# Patient Record
Sex: Female | Born: 1985 | Hispanic: No | Marital: Married | Smoking: Never smoker
Health system: Southern US, Community
[De-identification: ages and names within clinical notes are randomized; demographics above are authoritative.]

## PROBLEM LIST (undated history)

## (undated) ENCOUNTER — Inpatient Hospital Stay (HOSPITAL_COMMUNITY): Payer: Self-pay

## (undated) DIAGNOSIS — Z789 Other specified health status: Secondary | ICD-10-CM

## (undated) DIAGNOSIS — O99119 Other diseases of the blood and blood-forming organs and certain disorders involving the immune mechanism complicating pregnancy, unspecified trimester: Principal | ICD-10-CM

## (undated) DIAGNOSIS — O0990 Supervision of high risk pregnancy, unspecified, unspecified trimester: Secondary | ICD-10-CM

## (undated) DIAGNOSIS — D696 Thrombocytopenia, unspecified: Principal | ICD-10-CM

## (undated) HISTORY — DX: Other diseases of the blood and blood-forming organs and certain disorders involving the immune mechanism complicating pregnancy, unspecified trimester: O99.119

## (undated) HISTORY — DX: Supervision of high risk pregnancy, unspecified, unspecified trimester: O09.90

## (undated) HISTORY — PX: NO PAST SURGERIES: SHX2092

## (undated) HISTORY — DX: Thrombocytopenia, unspecified: D69.6

---

## 2014-04-14 ENCOUNTER — Telehealth: Payer: Self-pay | Admitting: Hematology & Oncology

## 2014-04-14 NOTE — Telephone Encounter (Signed)
Left vm w NEW PATIENT today to remind them of their appointment with Dr. Ennever. Also, advised them to bring all medication bottles and insurance card information. ° °

## 2014-04-15 ENCOUNTER — Ambulatory Visit: Payer: Self-pay

## 2014-04-15 ENCOUNTER — Ambulatory Visit: Payer: Self-pay | Admitting: Family

## 2014-04-15 ENCOUNTER — Other Ambulatory Visit: Payer: Self-pay | Admitting: Lab

## 2017-04-19 LAB — OB RESULTS CONSOLE HEPATITIS B SURFACE ANTIGEN: Hepatitis B Surface Ag: NEGATIVE

## 2017-04-19 LAB — OB RESULTS CONSOLE RPR: RPR: NONREACTIVE

## 2017-04-19 LAB — OB RESULTS CONSOLE RUBELLA ANTIBODY, IGM: Rubella: IMMUNE

## 2017-04-19 LAB — OB RESULTS CONSOLE HIV ANTIBODY (ROUTINE TESTING): HIV: NONREACTIVE

## 2017-05-03 LAB — OB RESULTS CONSOLE GC/CHLAMYDIA
Chlamydia: NEGATIVE
Gonorrhea: NEGATIVE

## 2017-07-25 NOTE — L&D Delivery Note (Signed)
Delivery Note At 2:57 PM a viable and healthy female was delivered via  (Presentation: LOA  ).  APGAR: 8, 9; weight pending.   Placenta status: spontaneous, intact.  Cord:  with the following complications: none.  Cord pH: na  Anesthesia:  epidural Episiotomy:  na Lacerations:  second Suture Repair: 2.0 vicryl rapide Est. Blood Loss (mL):  300  Mom to postpartum.  Baby to Couplet care / Skin to Skin.  Petrina Melby J 11/17/2017, 3:21 PM

## 2017-08-27 ENCOUNTER — Inpatient Hospital Stay (HOSPITAL_COMMUNITY)
Admission: AD | Admit: 2017-08-27 | Discharge: 2017-08-28 | Disposition: A | Discharge status: Home / Self Care | Payer: Managed Care, Other (non HMO) | Source: Ambulatory Visit | Attending: Obstetrics and Gynecology | Admitting: Obstetrics and Gynecology

## 2017-08-27 ENCOUNTER — Inpatient Hospital Stay (HOSPITAL_COMMUNITY): Payer: Managed Care, Other (non HMO)

## 2017-08-27 ENCOUNTER — Encounter (HOSPITAL_COMMUNITY): Payer: Self-pay | Admitting: *Deleted

## 2017-08-27 DIAGNOSIS — O288 Other abnormal findings on antenatal screening of mother: Secondary | ICD-10-CM

## 2017-08-27 DIAGNOSIS — Z3A27 27 weeks gestation of pregnancy: Secondary | ICD-10-CM | POA: Diagnosis not present

## 2017-08-27 DIAGNOSIS — O99512 Diseases of the respiratory system complicating pregnancy, second trimester: Secondary | ICD-10-CM | POA: Diagnosis not present

## 2017-08-27 DIAGNOSIS — J101 Influenza due to other identified influenza virus with other respiratory manifestations: Secondary | ICD-10-CM | POA: Insufficient documentation

## 2017-08-27 DIAGNOSIS — J069 Acute upper respiratory infection, unspecified: Secondary | ICD-10-CM

## 2017-08-27 DIAGNOSIS — R509 Fever, unspecified: Secondary | ICD-10-CM | POA: Diagnosis present

## 2017-08-27 HISTORY — DX: Other specified health status: Z78.9

## 2017-08-27 LAB — URINALYSIS, ROUTINE W REFLEX MICROSCOPIC
Bilirubin Urine: NEGATIVE
Glucose, UA: NEGATIVE mg/dL
Hgb urine dipstick: NEGATIVE
Ketones, ur: NEGATIVE mg/dL
Leukocytes, UA: NEGATIVE
Nitrite: NEGATIVE
Protein, ur: NEGATIVE mg/dL
Specific Gravity, Urine: 1.005 (ref 1.005–1.030)
pH: 7 (ref 5.0–8.0)

## 2017-08-27 LAB — INFLUENZA PANEL BY PCR (TYPE A & B)
Influenza A By PCR: POSITIVE — AB
Influenza B By PCR: NEGATIVE

## 2017-08-27 MED ORDER — ACETAMINOPHEN 325 MG PO TABS
650.0000 mg | ORAL_TABLET | Freq: Once | ORAL | Status: AC
  Administered 2017-08-27: 650 mg via ORAL
  Filled 2017-08-27: qty 2

## 2017-08-27 MED ORDER — OSELTAMIVIR PHOSPHATE 75 MG PO CAPS: 75.0000 mg | ORAL_CAPSULE | Freq: Two times a day (BID) | ORAL | 0 refills | Status: DC

## 2017-08-27 NOTE — MAU Note (Signed)
Pt presents to MAU c/o dry cough and a fever of 100-101.6.  Pt states she feels very tired. No vaginal bleeding or LOF. Pt reports normal vaginal discharge. Pt reports fetal movement.

## 2017-08-27 NOTE — MAU Provider Note (Signed)
History     CSN: [id number]  Arrival date and time: 08/27/17 [id number]   First Provider Initiated Contact with Patient 08/27/17 2014      Chief Complaint  Patient presents with  . Fever   HPI Pamela Stephens 32 y.o. [id number]  Comes to MAU tonight with fever and dry cough that occurs primarily when she is sitting up in bed.  Has had nasal congestion in the pregnancy and was advised to use a humidifier and saline nasal drops.  She had used the humidifier until she moved a few days ago.  Today she has felt bad and has been in bed sleeping most of the day.  She has not taken any tylenol today.  Has been using hot water with ginger and honey but is continuing to cough.  Client has had a flu shot this season.  Next appointment in the office is Friday for glucola testing.  OB History    Gravida Para Term Preterm AB Living   2             SAB TAB Ectopic Multiple Live Births                  Past Medical History:  Diagnosis Date  . Medical history non-contributory     Past Surgical History:  Procedure Laterality Date  . NO PAST SURGERIES      Family History  Problem Relation Age of Onset  . Diabetes Mother   . Cancer Maternal Grandmother   . Diabetes Maternal Grandmother     Social History   Tobacco Use  . Smoking status: Never Smoker  . Smokeless tobacco: Never Used  Substance Use Topics  . Alcohol use: No    Frequency: Never  . Drug use: No    Allergies: No Known Allergies  Medications Prior to Admission  Medication Sig Dispense Refill Last Dose  . Prenatal Vit-Fe Fumarate-FA (PRENATAL MULTIVITAMIN) TABS tablet Take 1 tablet by mouth daily at 12 noon.   08/26/2017 at Unknown time    Review of Systems  Constitutional: Positive for fever.  HENT: Positive for congestion. Negative for sneezing.   Respiratory: Positive for cough. Negative for shortness of breath and wheezing.   Gastrointestinal: Negative for abdominal pain, diarrhea, nausea and vomiting.  Genitourinary:  Negative for dysuria, vaginal bleeding and vaginal discharge.   Physical Exam   Blood pressure (!) 115/54, pulse (!) 129, temperature (!) 101.2 F (38.4 C), temperature source Oral, resp. rate 20, height 5\' 4"  (1.626 m), weight 138 lb 1.9 oz (62.7 kg), SpO2 100 %.  Physical Exam  Nursing note and vitals reviewed. Constitutional: She is oriented to person, place, and time. She appears well-developed and well-nourished.  HENT:  Head: Normocephalic.  Mouth/Throat: No oropharyngeal exudate.  Pharynx lightly pink, tonsils normal size  Eyes: EOM are normal. Right eye exhibits no discharge. Left eye exhibits no discharge.  Inner lids red  Neck: Neck supple.  Cardiovascular: Regular rhythm and normal heart sounds.  Mild tachycardia - 117-120  Respiratory: Effort normal and breath sounds normal. She has no wheezes.  GI: Soft. There is no tenderness.  FHT baseline 150 with moderate variability and 10x10 accels noted - reassuring strip.  No contractions felt by client or palpated.  No decelerations.  Will discontinue the monitor for now as client has URI rather than pelvic symptoms.  Musculoskeletal: Normal range of motion.  Neurological: She is alert and oriented to person, place, and time.  Skin: Skin is warm and  dry.  Psychiatric: She has a normal mood and affect.    MAU Course  Procedures  MDM Care assumed by L Leftwich, CNM at 2100  Assessment and Plan    Currie Paris 08/27/2017, 8:57 PM   Results for orders placed or performed during the hospital encounter of 08/27/17 (from the past 24 hour(s))  Urinalysis, Routine w reflex microscopic     Status: Abnormal   Collection Time: 08/27/17  7:49 PM  Result Value Ref Range   Color, Urine STRAW (A) YELLOW   APPearance CLEAR CLEAR   Specific Gravity, Urine 1.005 1.005 - 1.030   pH 7.0 5.0 - 8.0   Glucose, UA NEGATIVE NEGATIVE mg/dL   Hgb urine dipstick NEGATIVE NEGATIVE   Bilirubin Urine NEGATIVE NEGATIVE   Ketones, ur NEGATIVE  NEGATIVE mg/dL   Protein, ur NEGATIVE NEGATIVE mg/dL   Nitrite NEGATIVE NEGATIVE   Leukocytes, UA NEGATIVE NEGATIVE  Influenza panel by PCR (type A & B)     Status: Abnormal   Collection Time: 08/27/17  8:18 PM  Result Value Ref Range   Influenza A By PCR POSITIVE (A) NEGATIVE   Influenza B By PCR NEGATIVE NEGATIVE    MDM:  Pt flu positive today.  FHR tracing overall reactive (baseline 135, moderate variability, positive accels) but 2 questionable variables versus artifact with pt position.  Intermittent episodes of contractions 2-3 minutes apart, palpate mild, pt is not aware of them. Cervix 0/thick/high/posterior so no signs of labor.  Consult Dr Amado Nash with assessment and findings. BPP done and results 8/8.  D/C home with Tamiflu Rx.  List of safe OTC meds in pregnancy given.  Pt has appt in office with Taavon on Friday 2/8.  Pt may be able to keep this appt if symptoms improved but call office tomorrow.  D/C with infection precautions.  A: 1. Influenza A   2. NST (non-stress test) nonreactive   3. Acute upper respiratory infection   4. Pregnancy with 27 completed weeks gestation     P: D/C home  Sharen Counter, CNM 1:09 AM

## 2017-08-28 MED ORDER — OSELTAMIVIR PHOSPHATE 75 MG PO CAPS: 75.0000 mg | ORAL_CAPSULE | Freq: Two times a day (BID) | ORAL | 0 refills | Status: AC

## 2017-09-04 ENCOUNTER — Encounter: Payer: Self-pay | Admitting: Oncology

## 2017-09-04 ENCOUNTER — Ambulatory Visit (INDEPENDENT_AMBULATORY_CARE_PROVIDER_SITE_OTHER): Payer: Managed Care, Other (non HMO) | Admitting: Oncology

## 2017-09-04 ENCOUNTER — Other Ambulatory Visit: Payer: Self-pay | Admitting: Oncology

## 2017-09-04 ENCOUNTER — Other Ambulatory Visit: Payer: Self-pay

## 2017-09-04 VITALS — BP 123/71 | HR 86 | Temp 98.1°F | Ht 64.0 in | Wt 136.8 lb

## 2017-09-04 DIAGNOSIS — J302 Other seasonal allergic rhinitis: Secondary | ICD-10-CM

## 2017-09-04 DIAGNOSIS — J101 Influenza due to other identified influenza virus with other respiratory manifestations: Secondary | ICD-10-CM | POA: Diagnosis not present

## 2017-09-04 DIAGNOSIS — Z3A29 29 weeks gestation of pregnancy: Secondary | ICD-10-CM | POA: Diagnosis not present

## 2017-09-04 DIAGNOSIS — Z8613 Personal history of malaria: Secondary | ICD-10-CM | POA: Diagnosis not present

## 2017-09-04 DIAGNOSIS — D693 Immune thrombocytopenic purpura: Secondary | ICD-10-CM | POA: Diagnosis not present

## 2017-09-04 DIAGNOSIS — O99119 Other diseases of the blood and blood-forming organs and certain disorders involving the immune mechanism complicating pregnancy, unspecified trimester: Principal | ICD-10-CM

## 2017-09-04 DIAGNOSIS — D696 Thrombocytopenia, unspecified: Secondary | ICD-10-CM

## 2017-09-04 DIAGNOSIS — O99113 Other diseases of the blood and blood-forming organs and certain disorders involving the immune mechanism complicating pregnancy, third trimester: Secondary | ICD-10-CM | POA: Diagnosis not present

## 2017-09-04 DIAGNOSIS — O0990 Supervision of high risk pregnancy, unspecified, unspecified trimester: Secondary | ICD-10-CM

## 2017-09-04 DIAGNOSIS — Z7952 Long term (current) use of systemic steroids: Secondary | ICD-10-CM

## 2017-09-04 HISTORY — DX: Thrombocytopenia, unspecified: D69.6

## 2017-09-04 HISTORY — DX: Thrombocytopenia, unspecified: O99.119

## 2017-09-04 HISTORY — DX: Supervision of high risk pregnancy, unspecified, unspecified trimester: O09.90

## 2017-09-04 LAB — CBC WITH DIFFERENTIAL/PLATELET
Basophils Absolute: 0 10*3/uL (ref 0.0–0.1)
Basophils Relative: 0 %
Eosinophils Absolute: 0.1 10*3/uL (ref 0.0–0.7)
Eosinophils Relative: 1 %
HCT: 37.7 % (ref 36.0–46.0)
Hemoglobin: 12.3 g/dL (ref 12.0–15.0)
Lymphocytes Relative: 26 %
Lymphs Abs: 2.6 10*3/uL (ref 0.7–4.0)
MCH: 29.8 pg (ref 26.0–34.0)
MCHC: 32.6 g/dL (ref 30.0–36.0)
MCV: 91.3 fL (ref 78.0–100.0)
Monocytes Absolute: 0.6 10*3/uL (ref 0.1–1.0)
Monocytes Relative: 6 %
Neutro Abs: 6.6 10*3/uL (ref 1.7–7.7)
Neutrophils Relative %: 67 %
Platelets: 95 10*3/uL — ABNORMAL LOW (ref 150–400)
RBC: 4.13 MIL/uL (ref 3.87–5.11)
RDW: 13.5 % (ref 11.5–15.5)
WBC: 9.9 10*3/uL (ref 4.0–10.5)

## 2017-09-04 LAB — COMPREHENSIVE METABOLIC PANEL
ALT: 25 U/L (ref 14–54)
AST: 35 U/L (ref 15–41)
Albumin: 3.2 g/dL — ABNORMAL LOW (ref 3.5–5.0)
Alkaline Phosphatase: 115 U/L (ref 38–126)
Anion gap: 12 (ref 5–15)
BUN: <5 mg/dL — ABNORMAL LOW (ref 6–20)
CO2: 24 mmol/L (ref 22–32)
Calcium: 9.2 mg/dL (ref 8.9–10.3)
Chloride: 102 mmol/L (ref 101–111)
Creatinine, Ser: 0.57 mg/dL (ref 0.44–1.00)
GFR calc Af Amer: >60 mL/min (ref >60)
GFR calc non Af Amer: >60 mL/min (ref >60)
Glucose, Bld: 118 mg/dL — ABNORMAL HIGH (ref 65–99)
Potassium: 3.6 mmol/L (ref 3.5–5.1)
Sodium: 138 mmol/L (ref 135–145)
Total Bilirubin: 0.3 mg/dL (ref 0.3–1.2)
Total Protein: 6.9 g/dL (ref 6.5–8.1)

## 2017-09-04 LAB — SAVE SMEAR

## 2017-09-04 LAB — LACTATE DEHYDROGENASE: LDH: 197 U/L — ABNORMAL HIGH (ref 98–192)

## 2017-09-04 NOTE — Patient Instructions (Signed)
Stay on prednisone 40 mg daily Return every week for a platelet count: next Monday 2/18 I will likely be able to reduce the dose if counts good next week

## 2017-09-04 NOTE — Progress Notes (Signed)
New Patient Hematology   Pamela Stephens [id number] 1985/11/26 31 y.o. 09/04/2017  CC: Dr. Flavia Shipperichard tailbone  Reason for referral: Severe thrombocytopenia week 29 first pregnancy   HPI:  Pleasant 32 year old woman who emigrated to this country from [place] about 5 years ago.  She has been in overall excellent health without any major medical or surgical illness.  She did have malaria as a child age 89. In September 2015 she went to an urgent care center for evaluation of upper respiratory symptoms associated with seasonal allergies.  Blood counts were done and she was told her platelet count was 55,000.  A hematology referral was made but when platelet count was checked 4 days later it was 225,000 and a subsequent follow-up 1 month later remained normal. On Saturday, February 2 she developed a dry cough, fatigue, but no fever or GI complaints.  She saw her obstetrician.  Baby was doing fine.  Influenza panel was checked and she was positive for influenza A.  She was started on Tamiflu.  She took the medication for 2 days but then stopped because she developed diarrhea and by that time her cough had resolved.  Unexpectedly, blood counts drawn on day of visit February 8 showed a platelet count of 15,000 reproducible x2.  Hemoglobin 12.  White count 5000.  62% neutrophils.  31 lymphocytes. She was on no other medication except for prenatal vitamins.  No known medication allergies.  No alcoholic beverages since becoming pregnant.  Only rare glass of wine or beer prior to that.  No quinine-containing beverages or quinine supplements.  She took a muscle relaxant for about 2 days 2 months ago.  No signs or symptoms of a collagen vascular disease and specifically no polymyalgia, polyarthralgia, unusual skin rashes, or hair loss. There is no family history of any blood disorder.  She is an only child.  When I received the call on February 8 from her OB/GYN I advised initiation of steroids, prednisone 40 mg  daily.  I called the patient to make sure she had no bleeding and gave her my cell phone to call me over the weekend if she developed any problems.  Fortunately she did well and had no issues over the weekend.  PMH: Past Medical History:  Diagnosis Date  . Medical history non-contributory   . Pregnancy, high-risk 09/04/2017  . Thrombocytopenia affecting pregnancy, antepartum (HCC) 09/04/2017   Platelet count 15,000 07/31/2017 2nd trimester 28 weeks 1st pregnancy  Migraine headache.  Malaria at age 89.  Jaundice at that time.  Denies mononucleosis, hepatitis, tuberculosis, pneumonia.  Past Surgical History:  Procedure Laterality Date  . NO PAST SURGERIES      Allergies: No Known Allergies  Medications:.  I.  She took 20 mg on September 01 1038 mg on February 9 and 10.  Current Outpatient Medications:  .  Prenatal Vit-Fe Fumarate-FA (PRENATAL MULTIVITAMIN) TABS tablet, Take 1 tablet by mouth daily at 12 noon., Disp: , Rfl:   Social History: She has a degree in pharmacology in [place].  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 She  has never smoked.  She drinks an occasional glass of beer or wine not more than once per week and stopped all alcohol consumption at time of pregnancy.  She does not use drugs.  Family History: Family History  Problem Relation Age of Onset  . Diabetes Mother   . Cancer Maternal Grandmother   . Diabetes Maternal Grandmother   Father died  at age 22 of sudden cardiac death.  He was an alcohol abuser.  Mother alive and well at age 96 with recently diagnosed diabetes.  Review of Systems: No easy bruisability.  She has not noted any petechial rash. She denied any headache or blurred vision. No epistaxis, gum bleeding, hematuria, melena, or hematochezia. She is tolerating the prednisone well.  No GI upset.  No insomnia.  Mild elevation of blood glucose today at 118 and expected increase in her white blood count at  9900. Remaining ROS negative.  Physical Exam: Blood pressure 123/71, pulse 86, temperature 98.1 F (36.7 C), temperature source Oral, height 5\' 4"  (1.626 m), weight 136 lb 12.8 oz (62.1 kg), SpO2 100 %. Wt Readings from Last 3 Encounters:  09/04/17 136 lb 12.8 oz (62.1 kg)  08/27/17 138 lb 1.9 oz (62.7 kg)     General appearance: Well-nourished Asian woman HENNT: Pharynx no erythema, exudate, mass, or ulcer. No thyromegaly or thyroid nodules Lymph nodes: No cervical, supraclavicular, or axillary lymphadenopathy Breasts:  Lungs: Clear to auscultation, resonant to percussion throughout Heart: Regular rhythm, no murmur, no gallop, no rub, no click, no edema Abdomen: Soft, nontender, normal bowel sounds, no mass, second trimester pregnancy Extremities: No edema, no calf tenderness Musculoskeletal: no joint deformities GU: Vascular:  Neurologic: Alert, oriented, PERRLA, optic discs sharp and vessels normal, no hemorrhage or exudate, cranial nerves grossly normal, motor strength 5 over 5, reflexes 2+ symmetric, upper body coordination normal, gait normal, Skin: No rash, petechiae, or ecchymosis    Lab Results: Lab Results  Component Value Date   WBC 9.9 09/04/2017   HGB 12.3 09/04/2017   HCT 37.7 09/04/2017   MCV 91.3 09/04/2017   PLT 95 (L) 09/04/2017     Chemistry      Component Value Date/Time   NA 138 09/04/2017 1353   K 3.6 09/04/2017 1353   CL 102 09/04/2017 1353   CO2 24 09/04/2017 1353   BUN <5 (L) 09/04/2017 1353   CREATININE 0.57 09/04/2017 1353      Component Value Date/Time   CALCIUM 9.2 09/04/2017 1353   ALKPHOS 115 09/04/2017 1353   AST 35 09/04/2017 1353   ALT 25 09/04/2017 1353   BILITOT 0.3 09/04/2017 1353       Review of peripheral blood film: Normochromic normocytic red cells.  No schistocytes.  Mature neutrophils and lymphocytes.  Platelets appear normal in number at least 10 per high-power field with a subpopulation of large platelets.  Estimate  from smear higher than machine estimate: Approximately 150,000.   Radiological Studies: No results found.    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 Since we do not have any blood counts from earlier in the pregnancy it is difficult to assess the acuity of the fall in her platelets.  Given the extremely low count and giving her the benefit of the doubt, despite the fact that her platelet count today is near normal, I would like to continue the steroids.  Recommendation: Continue prednisone 40 mg daily. Weekly blood counts for the duration of the pregnancy. If platelet count is normal next week, I would likely decrease the prednisone to 20 mg daily.    Cephas Darby, MD, FACP  Hematology-Oncology/Internal Medicine  09/04/2017, 5:09 PM

## 2017-09-04 NOTE — Addendum Note (Signed)
Addended by: Remus BlakeBARROW, Nigel Wessman K on: 09/04/2017 01:51 PM   Modules accepted: Orders

## 2017-09-06 LAB — ANTINUCLEAR ANTIBODIES, IFA: ANA Ab, IFA: NEGATIVE

## 2017-09-11 ENCOUNTER — Telehealth: Payer: Self-pay | Admitting: *Deleted

## 2017-09-11 ENCOUNTER — Other Ambulatory Visit (INDEPENDENT_AMBULATORY_CARE_PROVIDER_SITE_OTHER): Payer: Managed Care, Other (non HMO)

## 2017-09-11 DIAGNOSIS — D696 Thrombocytopenia, unspecified: Secondary | ICD-10-CM

## 2017-09-11 DIAGNOSIS — O0990 Supervision of high risk pregnancy, unspecified, unspecified trimester: Secondary | ICD-10-CM

## 2017-09-11 DIAGNOSIS — O99119 Other diseases of the blood and blood-forming organs and certain disorders involving the immune mechanism complicating pregnancy, unspecified trimester: Secondary | ICD-10-CM | POA: Diagnosis not present

## 2017-09-11 LAB — CBC WITH DIFFERENTIAL/PLATELET
Basophils Absolute: 0 10*3/uL (ref 0.0–0.1)
Basophils Relative: 0 %
Eosinophils Absolute: 0 10*3/uL (ref 0.0–0.7)
Eosinophils Relative: 0 %
HCT: 37.3 % (ref 36.0–46.0)
Hemoglobin: 12 g/dL (ref 12.0–15.0)
Lymphocytes Relative: 22 %
Lymphs Abs: 2.3 10*3/uL (ref 0.7–4.0)
MCH: 29.5 pg (ref 26.0–34.0)
MCHC: 32.2 g/dL (ref 30.0–36.0)
MCV: 91.6 fL (ref 78.0–100.0)
Monocytes Absolute: 0.6 10*3/uL (ref 0.1–1.0)
Monocytes Relative: 6 %
Neutro Abs: 7.3 10*3/uL (ref 1.7–7.7)
Neutrophils Relative %: 72 %
Platelets: 288 10*3/uL (ref 150–400)
RBC: 4.07 MIL/uL (ref 3.87–5.11)
RDW: 13.6 % (ref 11.5–15.5)
WBC: 10.2 10*3/uL (ref 4.0–10.5)

## 2017-09-11 NOTE — Telephone Encounter (Signed)
Pt called / informed "platelet count normal at 288,000. Decrease prednisone to 1 pill = 20 mg daily. Repeat count in 1 week, if still good, will decrease to 10 mg then probably stop 1 week after that " per Dr Cyndie ChimeGranfortuna.  Lab appt scheduled next Monday @ 1130 AM.

## 2017-09-11 NOTE — Telephone Encounter (Signed)
-----   Message from Levert FeinsteinJames M Granfortuna, MD sent at 09/11/2017  1:10 PM EST ----- Call pt: platelet count normal at 288,000. Decrease prednisone to 1 pill = 20 mg daily. Repeat count in 1 week, if still good, will decrease to 10 mg then probably stop 1 week after that

## 2017-09-11 NOTE — Addendum Note (Signed)
Addended by: Remus BlakeBARROW, Maurice Fotheringham K on: 09/11/2017 12:02 PM   Modules accepted: Orders

## 2017-09-18 ENCOUNTER — Other Ambulatory Visit: Payer: Managed Care, Other (non HMO)

## 2017-09-18 ENCOUNTER — Telehealth: Payer: Self-pay | Admitting: *Deleted

## 2017-09-18 ENCOUNTER — Other Ambulatory Visit (INDEPENDENT_AMBULATORY_CARE_PROVIDER_SITE_OTHER): Payer: Managed Care, Other (non HMO)

## 2017-09-18 DIAGNOSIS — O99119 Other diseases of the blood and blood-forming organs and certain disorders involving the immune mechanism complicating pregnancy, unspecified trimester: Secondary | ICD-10-CM | POA: Diagnosis not present

## 2017-09-18 DIAGNOSIS — D696 Thrombocytopenia, unspecified: Secondary | ICD-10-CM | POA: Diagnosis not present

## 2017-09-18 DIAGNOSIS — O0990 Supervision of high risk pregnancy, unspecified, unspecified trimester: Secondary | ICD-10-CM

## 2017-09-18 LAB — CBC WITH DIFFERENTIAL/PLATELET
Basophils Absolute: 0 10*3/uL (ref 0.0–0.1)
Basophils Relative: 0 %
Eosinophils Absolute: 0.1 10*3/uL (ref 0.0–0.7)
Eosinophils Relative: 0 %
HCT: 35.4 % — ABNORMAL LOW (ref 36.0–46.0)
Hemoglobin: 11.6 g/dL — ABNORMAL LOW (ref 12.0–15.0)
Lymphocytes Relative: 23 %
Lymphs Abs: 2.7 10*3/uL (ref 0.7–4.0)
MCH: 29.4 pg (ref 26.0–34.0)
MCHC: 32.8 g/dL (ref 30.0–36.0)
MCV: 89.8 fL (ref 78.0–100.0)
Monocytes Absolute: 0.9 10*3/uL (ref 0.1–1.0)
Monocytes Relative: 7 %
Neutro Abs: 8 10*3/uL — ABNORMAL HIGH (ref 1.7–7.7)
Neutrophils Relative %: 70 %
Platelets: 175 10*3/uL (ref 150–400)
RBC: 3.94 MIL/uL (ref 3.87–5.11)
RDW: 13.2 % (ref 11.5–15.5)
WBC: 11.6 10*3/uL — ABNORMAL HIGH (ref 4.0–10.5)

## 2017-09-18 NOTE — Telephone Encounter (Signed)
-----   Message from Levert FeinsteinJames M Granfortuna, MD sent at 09/18/2017  3:51 PM EST ----- Call pt: platelets god at 175,000. She can decrease prednisone to 1/2 tablet = 10 mg daily. Repeat CBC again in one week.

## 2017-09-18 NOTE — Telephone Encounter (Signed)
Pt called / informed "platelets god at 175,000. She can decrease prednisone to 1/2 tablet = 10 mg daily. Repeat CBC again in one week." per DR Granfortuna. Voiced understanding. Appt scheduled next Monday 3/4 @ 1330 PM.

## 2017-09-25 ENCOUNTER — Other Ambulatory Visit (INDEPENDENT_AMBULATORY_CARE_PROVIDER_SITE_OTHER): Payer: Managed Care, Other (non HMO)

## 2017-09-25 ENCOUNTER — Other Ambulatory Visit: Payer: Managed Care, Other (non HMO)

## 2017-09-25 ENCOUNTER — Other Ambulatory Visit: Payer: Self-pay | Admitting: Oncology

## 2017-09-25 DIAGNOSIS — O99119 Other diseases of the blood and blood-forming organs and certain disorders involving the immune mechanism complicating pregnancy, unspecified trimester: Secondary | ICD-10-CM

## 2017-09-25 DIAGNOSIS — O0990 Supervision of high risk pregnancy, unspecified, unspecified trimester: Secondary | ICD-10-CM

## 2017-09-25 DIAGNOSIS — D696 Thrombocytopenia, unspecified: Secondary | ICD-10-CM | POA: Diagnosis not present

## 2017-09-25 LAB — CBC WITH DIFFERENTIAL/PLATELET
Basophils Absolute: 0 10*3/uL (ref 0.0–0.1)
Basophils Relative: 0 %
Eosinophils Absolute: 0.1 10*3/uL (ref 0.0–0.7)
Eosinophils Relative: 1 %
HCT: 37.4 % (ref 36.0–46.0)
Hemoglobin: 12.1 g/dL (ref 12.0–15.0)
Lymphocytes Relative: 22 %
Lymphs Abs: 2.5 10*3/uL (ref 0.7–4.0)
MCH: 29.5 pg (ref 26.0–34.0)
MCHC: 32.4 g/dL (ref 30.0–36.0)
MCV: 91.2 fL (ref 78.0–100.0)
Monocytes Absolute: 0.8 10*3/uL (ref 0.1–1.0)
Monocytes Relative: 7 %
Neutro Abs: 8 10*3/uL — ABNORMAL HIGH (ref 1.7–7.7)
Neutrophils Relative %: 70 %
Platelets: 115 10*3/uL — ABNORMAL LOW (ref 150–400)
RBC: 4.1 MIL/uL (ref 3.87–5.11)
RDW: 14 % (ref 11.5–15.5)
WBC: 11.4 10*3/uL — ABNORMAL HIGH (ref 4.0–10.5)

## 2017-09-25 MED ORDER — PREDNISONE 5 MG PO TABS: 10.0000 mg | ORAL_TABLET | Freq: Every day | ORAL | 3 refills | Status: DC

## 2017-09-28 ENCOUNTER — Telehealth: Payer: Self-pay | Admitting: *Deleted

## 2017-09-28 NOTE — Telephone Encounter (Signed)
Returned pt's call - stated needs refill om Prednisone. Informed rx was sent to [facility] on [address]- telephone # given, stated she will call them and call me back if they did not receive the refill.

## 2017-10-02 ENCOUNTER — Other Ambulatory Visit (INDEPENDENT_AMBULATORY_CARE_PROVIDER_SITE_OTHER): Payer: Managed Care, Other (non HMO)

## 2017-10-02 DIAGNOSIS — O99119 Other diseases of the blood and blood-forming organs and certain disorders involving the immune mechanism complicating pregnancy, unspecified trimester: Secondary | ICD-10-CM

## 2017-10-02 DIAGNOSIS — D696 Thrombocytopenia, unspecified: Secondary | ICD-10-CM | POA: Diagnosis not present

## 2017-10-02 DIAGNOSIS — O0990 Supervision of high risk pregnancy, unspecified, unspecified trimester: Secondary | ICD-10-CM

## 2017-10-02 LAB — CBC WITH DIFFERENTIAL/PLATELET
Basophils Absolute: 0 10*3/uL (ref 0.0–0.1)
Basophils Relative: 0 %
Eosinophils Absolute: 0.1 10*3/uL (ref 0.0–0.7)
Eosinophils Relative: 1 %
HCT: 37.1 % (ref 36.0–46.0)
Hemoglobin: 11.9 g/dL — ABNORMAL LOW (ref 12.0–15.0)
Lymphocytes Relative: 23 %
Lymphs Abs: 2.2 10*3/uL (ref 0.7–4.0)
MCH: 29.2 pg (ref 26.0–34.0)
MCHC: 32.1 g/dL (ref 30.0–36.0)
MCV: 90.9 fL (ref 78.0–100.0)
Monocytes Absolute: 0.8 10*3/uL (ref 0.1–1.0)
Monocytes Relative: 8 %
Neutro Abs: 6.4 10*3/uL (ref 1.7–7.7)
Neutrophils Relative %: 68 %
Platelets: 127 10*3/uL — ABNORMAL LOW (ref 150–400)
RBC: 4.08 MIL/uL (ref 3.87–5.11)
RDW: 13.9 % (ref 11.5–15.5)
WBC: 9.5 10*3/uL (ref 4.0–10.5)

## 2017-10-03 ENCOUNTER — Telehealth: Payer: Self-pay | Admitting: General Practice

## 2017-10-03 ENCOUNTER — Telehealth: Payer: Self-pay | Admitting: *Deleted

## 2017-10-03 NOTE — Telephone Encounter (Signed)
Patient calling about labs results from [facility] or Dr Cyndie Chime

## 2017-10-03 NOTE — Telephone Encounter (Signed)
-----   Message from Levert FeinsteinJames M Granfortuna, MD sent at 10/02/2017  4:33 PM EDT ----- Please call pt: machine count on platelets today 127,000. Stay on 10 mg prednisone. Repeat CBC in 2 weeks please.

## 2017-10-03 NOTE — Telephone Encounter (Signed)
Pt called / informed "machine count on platelets  127,000. Stay on 10 mg prednisone. Repeat CBC in 2 weeks" per Dr Cyndie ChimeGranfortuna. Voiced understanding. Lab appt scheduled Mon 3/25 @ 1130 AM.

## 2017-10-16 ENCOUNTER — Other Ambulatory Visit (INDEPENDENT_AMBULATORY_CARE_PROVIDER_SITE_OTHER): Payer: Managed Care, Other (non HMO)

## 2017-10-16 DIAGNOSIS — D696 Thrombocytopenia, unspecified: Secondary | ICD-10-CM | POA: Diagnosis not present

## 2017-10-16 DIAGNOSIS — O99119 Other diseases of the blood and blood-forming organs and certain disorders involving the immune mechanism complicating pregnancy, unspecified trimester: Secondary | ICD-10-CM

## 2017-10-16 DIAGNOSIS — O0990 Supervision of high risk pregnancy, unspecified, unspecified trimester: Secondary | ICD-10-CM

## 2017-10-16 LAB — CBC WITH DIFFERENTIAL/PLATELET
Basophils Absolute: 0 10*3/uL (ref 0.0–0.1)
Basophils Relative: 0 %
Eosinophils Absolute: 0 10*3/uL (ref 0.0–0.7)
Eosinophils Relative: 0 %
HCT: 37.7 % (ref 36.0–46.0)
Hemoglobin: 12.2 g/dL (ref 12.0–15.0)
Lymphocytes Relative: 13 %
Lymphs Abs: 1.5 10*3/uL (ref 0.7–4.0)
MCH: 28.2 pg (ref 26.0–34.0)
MCHC: 32.4 g/dL (ref 30.0–36.0)
MCV: 87.3 fL (ref 78.0–100.0)
Monocytes Absolute: 0.5 10*3/uL (ref 0.1–1.0)
Monocytes Relative: 4 %
Neutro Abs: 9.8 10*3/uL — ABNORMAL HIGH (ref 1.7–7.7)
Neutrophils Relative %: 83 %
Platelets: 139 10*3/uL — ABNORMAL LOW (ref 150–400)
RBC: 4.32 MIL/uL (ref 3.87–5.11)
RDW: 13.5 % (ref 11.5–15.5)
WBC: 11.8 10*3/uL — ABNORMAL HIGH (ref 4.0–10.5)

## 2017-10-17 ENCOUNTER — Telehealth: Payer: Self-pay | Admitting: *Deleted

## 2017-10-17 ENCOUNTER — Telehealth: Payer: Self-pay | Admitting: General Practice

## 2017-10-17 NOTE — Telephone Encounter (Signed)
Pt called / informed "platelets good at 139,000; stay on 10 mg prednisone; check CBC in 2 weeks " per Dr Cyndie ChimeGranfortuna. Lab appt scheduled Apr 8 @ 1130 AM.

## 2017-10-17 NOTE — Telephone Encounter (Signed)
Patient is calling about labs results

## 2017-10-17 NOTE — Telephone Encounter (Signed)
Glenda called her

## 2017-10-17 NOTE — Telephone Encounter (Signed)
-----   Message from Levert FeinsteinJames M Granfortuna, MD sent at 10/16/2017  4:25 PM EDT ----- Call pt: platelets good at 139,000; stay on 10 mg prednisone; check CBC in 2 weeks

## 2017-10-24 LAB — OB RESULTS CONSOLE GBS: GBS: NEGATIVE

## 2017-10-27 ENCOUNTER — Other Ambulatory Visit: Payer: Self-pay

## 2017-10-27 MED ORDER — PREDNISONE 5 MG PO TABS: 10.0000 mg | ORAL_TABLET | Freq: Every day | ORAL | 3 refills | Status: DC

## 2017-10-27 NOTE — Telephone Encounter (Signed)
predniSONE (DELTASONE) 5 MG tablet, Refill request @ [facility] [address].

## 2017-10-27 NOTE — Telephone Encounter (Signed)
Need new rx with qty# 60 since "take 2 tabs daily".

## 2017-10-30 ENCOUNTER — Other Ambulatory Visit: Payer: Self-pay | Admitting: Oncology

## 2017-10-30 ENCOUNTER — Other Ambulatory Visit (INDEPENDENT_AMBULATORY_CARE_PROVIDER_SITE_OTHER): Payer: Managed Care, Other (non HMO)

## 2017-10-30 ENCOUNTER — Telehealth: Payer: Self-pay | Admitting: *Deleted

## 2017-10-30 ENCOUNTER — Other Ambulatory Visit: Payer: Managed Care, Other (non HMO)

## 2017-10-30 DIAGNOSIS — O99119 Other diseases of the blood and blood-forming organs and certain disorders involving the immune mechanism complicating pregnancy, unspecified trimester: Secondary | ICD-10-CM

## 2017-10-30 DIAGNOSIS — O0990 Supervision of high risk pregnancy, unspecified, unspecified trimester: Secondary | ICD-10-CM

## 2017-10-30 DIAGNOSIS — D696 Thrombocytopenia, unspecified: Secondary | ICD-10-CM | POA: Diagnosis not present

## 2017-10-30 LAB — CBC WITH DIFFERENTIAL/PLATELET
Basophils Absolute: 0 10*3/uL (ref 0.0–0.1)
Basophils Relative: 0 %
Eosinophils Absolute: 0.1 10*3/uL (ref 0.0–0.7)
Eosinophils Relative: 1 %
HCT: 38.6 % (ref 36.0–46.0)
Hemoglobin: 12.4 g/dL (ref 12.0–15.0)
Lymphocytes Relative: 23 %
Lymphs Abs: 2.2 10*3/uL (ref 0.7–4.0)
MCH: 28.4 pg (ref 26.0–34.0)
MCHC: 32.1 g/dL (ref 30.0–36.0)
MCV: 88.5 fL (ref 78.0–100.0)
Monocytes Absolute: 0.7 10*3/uL (ref 0.1–1.0)
Monocytes Relative: 8 %
Neutro Abs: 6.4 10*3/uL (ref 1.7–7.7)
Neutrophils Relative %: 68 %
Platelets: 112 10*3/uL — ABNORMAL LOW (ref 150–400)
RBC: 4.36 MIL/uL (ref 3.87–5.11)
RDW: 14.4 % (ref 11.5–15.5)
WBC: 9.3 10*3/uL (ref 4.0–10.5)

## 2017-10-30 NOTE — Telephone Encounter (Signed)
thanks

## 2017-10-30 NOTE — Telephone Encounter (Signed)
-----   Message from Levert FeinsteinJames M Granfortuna, MD sent at 10/30/2017  1:17 PM EDT ----- Call pt: platelet count 112,000. Stay on 10 mg prednisone. Repeat CBC in 1 week

## 2017-10-30 NOTE — Telephone Encounter (Signed)
Pt called / informed "platelet count 112,000. Stay on 10 mg prednisone. Repeat CBC in 1 week" per Dr Cyndie ChimeGranfortuna. Voiced understanding. Lab appt scheduled next Monday @ 1130 AM.

## 2017-11-01 NOTE — Addendum Note (Signed)
Addended by: Bufford SpikesFULCHER, Ibn Stief N on: 11/01/2017 12:00 PM   Modules accepted: Orders

## 2017-11-06 ENCOUNTER — Other Ambulatory Visit: Payer: Managed Care, Other (non HMO)

## 2017-11-07 ENCOUNTER — Other Ambulatory Visit: Payer: Self-pay | Admitting: *Deleted

## 2017-11-07 ENCOUNTER — Other Ambulatory Visit (INDEPENDENT_AMBULATORY_CARE_PROVIDER_SITE_OTHER): Payer: Managed Care, Other (non HMO)

## 2017-11-07 ENCOUNTER — Telehealth: Payer: Self-pay | Admitting: *Deleted

## 2017-11-07 DIAGNOSIS — O99119 Other diseases of the blood and blood-forming organs and certain disorders involving the immune mechanism complicating pregnancy, unspecified trimester: Secondary | ICD-10-CM

## 2017-11-07 DIAGNOSIS — D699 Hemorrhagic condition, unspecified: Secondary | ICD-10-CM

## 2017-11-07 DIAGNOSIS — D696 Thrombocytopenia, unspecified: Secondary | ICD-10-CM

## 2017-11-07 LAB — CBC WITH DIFFERENTIAL/PLATELET
Band Neutrophils: 0 %
Basophils Absolute: 0 10*3/uL (ref 0.0–0.1)
Basophils Relative: 0 %
Blasts: 0 %
Eosinophils Absolute: 0.1 10*3/uL (ref 0.0–0.7)
Eosinophils Relative: 1 %
HCT: 41.1 % (ref 36.0–46.0)
Hemoglobin: 13.6 g/dL (ref 12.0–15.0)
Lymphocytes Relative: 28 %
Lymphs Abs: 2.7 10*3/uL (ref 0.7–4.0)
MCH: 29.4 pg (ref 26.0–34.0)
MCHC: 33.1 g/dL (ref 30.0–36.0)
MCV: 88.8 fL (ref 78.0–100.0)
Metamyelocytes Relative: 0 %
Monocytes Absolute: 0.8 10*3/uL (ref 0.1–1.0)
Monocytes Relative: 8 %
Myelocytes: 0 %
Neutro Abs: 5.9 10*3/uL (ref 1.7–7.7)
Neutrophils Relative %: 63 %
Other: 0 %
Platelets: 143 10*3/uL — ABNORMAL LOW (ref 150–400)
Promyelocytes Relative: 0 %
RBC: 4.63 MIL/uL (ref 3.87–5.11)
RDW: 14.9 % (ref 11.5–15.5)
WBC: 9.5 10*3/uL (ref 4.0–10.5)
nRBC: 0 /100 WBC

## 2017-11-07 NOTE — Telephone Encounter (Signed)
-----   Message from Levert FeinsteinJames M Granfortuna, MD sent at 11/07/2017  2:33 PM EDT ----- Call pt: platelet count good at 143,000; continue 10 mg prednisone until after delivery. When is due date? We should be OK to wait 2 weeks for next CBC

## 2017-11-07 NOTE — Telephone Encounter (Signed)
OK - thanks

## 2017-11-07 NOTE — Telephone Encounter (Signed)
Call from pt requesting lab results - informed "platelet count good at 143,000; continue 10 mg prednisone until after delivery. When is due date? We should be OK to wait 2 weeks for next CBC " per Dr Cyndie ChimeGranfortuna. Stated original due date was May 2 but after her last visit with her OB/GYN , it was moved up a week. But she will call me back on Thursday after her visit with update.

## 2017-11-09 ENCOUNTER — Telehealth: Payer: Self-pay | Admitting: General Practice

## 2017-11-09 NOTE — Telephone Encounter (Signed)
Patient wanted to let you know OBGYN said baby will be coming next week, pls call patient she says something else to say

## 2017-11-09 NOTE — Telephone Encounter (Signed)
Dr Cyndie ChimeGranfortuna stated he just finished talking to her.

## 2017-11-16 ENCOUNTER — Encounter (HOSPITAL_COMMUNITY): Payer: Self-pay | Admitting: *Deleted

## 2017-11-16 ENCOUNTER — Inpatient Hospital Stay (HOSPITAL_COMMUNITY)
Admission: AD | Admit: 2017-11-16 | Discharge: 2017-11-19 | DRG: 806 | Disposition: A | Discharge status: Home / Self Care | Payer: Managed Care, Other (non HMO) | Source: Ambulatory Visit | Attending: Obstetrics and Gynecology | Admitting: Obstetrics and Gynecology

## 2017-11-16 ENCOUNTER — Other Ambulatory Visit: Payer: Self-pay

## 2017-11-16 DIAGNOSIS — O365990 Maternal care for other known or suspected poor fetal growth, unspecified trimester, not applicable or unspecified: Secondary | ICD-10-CM | POA: Diagnosis present

## 2017-11-16 DIAGNOSIS — O99119 Other diseases of the blood and blood-forming organs and certain disorders involving the immune mechanism complicating pregnancy, unspecified trimester: Secondary | ICD-10-CM

## 2017-11-16 DIAGNOSIS — Z3A39 39 weeks gestation of pregnancy: Secondary | ICD-10-CM | POA: Diagnosis not present

## 2017-11-16 DIAGNOSIS — O9912 Other diseases of the blood and blood-forming organs and certain disorders involving the immune mechanism complicating childbirth: Secondary | ICD-10-CM | POA: Diagnosis present

## 2017-11-16 DIAGNOSIS — O365930 Maternal care for other known or suspected poor fetal growth, third trimester, not applicable or unspecified: Principal | ICD-10-CM | POA: Diagnosis present

## 2017-11-16 DIAGNOSIS — D693 Immune thrombocytopenic purpura: Secondary | ICD-10-CM | POA: Diagnosis present

## 2017-11-16 DIAGNOSIS — Z3490 Encounter for supervision of normal pregnancy, unspecified, unspecified trimester: Secondary | ICD-10-CM

## 2017-11-16 DIAGNOSIS — D696 Thrombocytopenia, unspecified: Secondary | ICD-10-CM | POA: Diagnosis present

## 2017-11-16 LAB — CBC
HCT: 43.1 % (ref 36.0–46.0)
Hemoglobin: 14.4 g/dL (ref 12.0–15.0)
MCH: 29 pg (ref 26.0–34.0)
MCHC: 33.4 g/dL (ref 30.0–36.0)
MCV: 86.7 fL (ref 78.0–100.0)
Platelets: 130 10*3/uL — ABNORMAL LOW (ref 150–400)
RBC: 4.97 MIL/uL (ref 3.87–5.11)
RDW: 15.2 % (ref 11.5–15.5)
WBC: 8.1 10*3/uL (ref 4.0–10.5)

## 2017-11-16 LAB — TYPE AND SCREEN
ABO/RH(D): B POS
Antibody Screen: NEGATIVE

## 2017-11-16 LAB — ABO/RH: ABO/RH(D): B POS

## 2017-11-16 MED ORDER — LACTATED RINGERS IV SOLN
500.0000 mL | INTRAVENOUS | Status: DC | PRN
  Administered 2017-11-17 (×2): 500 mL via INTRAVENOUS

## 2017-11-16 MED ORDER — FLEET ENEMA 7-19 GM/118ML RE ENEM: 1.0000 | ENEMA | RECTAL | Status: DC | PRN

## 2017-11-16 MED ORDER — MISOPROSTOL 25 MCG QUARTER TABLET: 25.0000 ug | ORAL_TABLET | ORAL | Status: DC | PRN

## 2017-11-16 MED ORDER — ONDANSETRON HCL 4 MG/2ML IJ SOLN: 4.0000 mg | Freq: Four times a day (QID) | INTRAMUSCULAR | Status: DC | PRN

## 2017-11-16 MED ORDER — ACETAMINOPHEN 325 MG PO TABS: 650.0000 mg | ORAL_TABLET | ORAL | Status: DC | PRN

## 2017-11-16 MED ORDER — LACTATED RINGERS IV SOLN
INTRAVENOUS | Status: DC
  Administered 2017-11-16: 21:00:00 via INTRAVENOUS

## 2017-11-16 MED ORDER — MISOPROSTOL 50MCG HALF TABLET
50.0000 ug | ORAL_TABLET | ORAL | Status: DC | PRN
Start: 2017-11-16 — End: 2017-11-17
  Administered 2017-11-16 – 2017-11-17 (×2): 50 ug via ORAL
  Filled 2017-11-16 (×3): qty 1

## 2017-11-16 MED ORDER — OXYTOCIN BOLUS FROM INFUSION
500.0000 mL | Freq: Once | INTRAVENOUS | Status: AC
  Administered 2017-11-17: 500 mL via INTRAVENOUS

## 2017-11-16 MED ORDER — LIDOCAINE HCL (PF) 1 % IJ SOLN
30.0000 mL | INTRAMUSCULAR | Status: DC | PRN
  Filled 2017-11-16: qty 30

## 2017-11-16 MED ORDER — TERBUTALINE SULFATE 1 MG/ML IJ SOLN
0.2500 mg | Freq: Once | INTRAMUSCULAR | Status: DC | PRN
  Filled 2017-11-16: qty 1

## 2017-11-16 MED ORDER — OXYCODONE-ACETAMINOPHEN 5-325 MG PO TABS: 2.0000 | ORAL_TABLET | ORAL | Status: DC | PRN

## 2017-11-16 MED ORDER — SOD CITRATE-CITRIC ACID 500-334 MG/5ML PO SOLN: 30.0000 mL | ORAL | Status: DC | PRN

## 2017-11-16 MED ORDER — OXYTOCIN 40 UNITS IN LACTATED RINGERS INFUSION - SIMPLE MED
1.0000 m[IU]/min | INTRAVENOUS | Status: DC
  Administered 2017-11-17: 2 m[IU]/min via INTRAVENOUS
  Filled 2017-11-16: qty 1000

## 2017-11-16 MED ORDER — OXYCODONE-ACETAMINOPHEN 5-325 MG PO TABS: 1.0000 | ORAL_TABLET | ORAL | Status: DC | PRN

## 2017-11-16 MED ORDER — OXYTOCIN 40 UNITS IN LACTATED RINGERS INFUSION - SIMPLE MED: 2.5000 [IU]/h | INTRAVENOUS | Status: DC

## 2017-11-16 NOTE — H&P (Signed)
Pamela CrankerRamya Stephens is a 32 y.o. 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 OB History    Gravida  1   Para      Term      Preterm      AB      Living        SAB      TAB      Ectopic      Multiple      Live Births             Past Medical History:  Diagnosis Date  . Medical history non-contributory   . Pregnancy, high-risk 09/04/2017  . Thrombocytopenia affecting pregnancy, antepartum (HCC) 09/04/2017   Platelet count 15,000 07/31/2017 2nd trimester 28 weeks 1st pregnancy   Past Surgical History:  Procedure Laterality Date  . NO PAST SURGERIES     Family History: family history includes Cancer in her maternal grandmother; Diabetes in her maternal grandmother and mother. Social History:  reports that she has never smoked. She has never used smokeless tobacco. She reports that she does not drink alcohol or use drugs.     Maternal Diabetes: No Genetic Screening: Normal Maternal Ultrasounds/Referrals: Normal-IUGR Fetal Ultrasounds or other Referrals:  None Maternal Substance Abuse:  No Significant Maternal Medications:  None- Prednisone for likely ITP Significant Maternal Lab Results:  None Other Comments:  None  Review of Systems  Constitutional: Negative.   All other systems reviewed and are negative.  Maternal Medical History:  Fetal activity: Perceived fetal activity is normal.   Last perceived fetal movement was within the past hour.    Prenatal complications: IUGR and thrombocytopenia.   No thrombophilia.   Prenatal Complications - Diabetes: none.      There were no vitals taken for this visit. Maternal Exam:  Uterine Assessment: Contraction strength is mild.  Contraction frequency is rare.   Abdomen: Patient reports no abdominal tenderness. Fetal presentation: vertex  Introitus: Normal vulva. Normal vagina.  Ferning test: not done.  Nitrazine test: not done. Amniotic fluid character: meconium stained.  Pelvis: questionable for  delivery.   Cervix: Cervix evaluated by digital exam.     Physical Exam  Nursing note and vitals reviewed. Constitutional: She is oriented to person, place, and time. She appears well-developed and well-nourished.  HENT:  Head: Normocephalic and atraumatic.  Neck: Normal range of motion. Neck supple.  Cardiovascular: Normal rate and regular rhythm.  Respiratory: Effort normal and breath sounds normal.  GI: Soft. Bowel sounds are normal.  Genitourinary: Vagina normal and uterus normal.  Musculoskeletal: Normal range of motion.  Neurological: She is alert and oriented to person, place, and time. She has normal reflexes.  Skin: Skin is warm and dry.  Psychiatric: She has a normal mood and affect.    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

## 2017-11-17 ENCOUNTER — Encounter (HOSPITAL_COMMUNITY): Payer: Self-pay | Admitting: Obstetrics

## 2017-11-17 ENCOUNTER — Inpatient Hospital Stay (HOSPITAL_COMMUNITY): Payer: Managed Care, Other (non HMO) | Admitting: Anesthesiology

## 2017-11-17 DIAGNOSIS — O365990 Maternal care for other known or suspected poor fetal growth, unspecified trimester, not applicable or unspecified: Secondary | ICD-10-CM | POA: Diagnosis present

## 2017-11-17 LAB — CBC
HCT: 43.2 % (ref 36.0–46.0)
HCT: 40.8 % (ref 36.0–46.0)
Hemoglobin: 14.6 g/dL (ref 12.0–15.0)
Hemoglobin: 13.8 g/dL (ref 12.0–15.0)
MCH: 29.4 pg (ref 26.0–34.0)
MCH: 29.6 pg (ref 26.0–34.0)
MCHC: 33.8 g/dL (ref 30.0–36.0)
MCHC: 33.8 g/dL (ref 30.0–36.0)
MCV: 87.1 fL (ref 78.0–100.0)
MCV: 87.6 fL (ref 78.0–100.0)
Platelets: 105 10*3/uL — ABNORMAL LOW (ref 150–400)
Platelets: 98 10*3/uL — ABNORMAL LOW (ref 150–400)
RBC: 4.96 MIL/uL (ref 3.87–5.11)
RBC: 4.66 MIL/uL (ref 3.87–5.11)
RDW: 15.2 % (ref 11.5–15.5)
RDW: 15.2 % (ref 11.5–15.5)
WBC: 9.1 10*3/uL (ref 4.0–10.5)
WBC: 13.6 10*3/uL — ABNORMAL HIGH (ref 4.0–10.5)

## 2017-11-17 LAB — RPR: RPR Ser Ql: NONREACTIVE

## 2017-11-17 MED ORDER — PRENATAL MULTIVITAMIN CH
1.0000 | ORAL_TABLET | Freq: Every day | ORAL | Status: DC
  Administered 2017-11-18 – 2017-11-19 (×2): 1 via ORAL
  Filled 2017-11-17 (×2): qty 1

## 2017-11-17 MED ORDER — LIDOCAINE HCL (PF) 1 % IJ SOLN
INTRAMUSCULAR | Status: DC | PRN
  Administered 2017-11-17: 6 mL
  Administered 2017-11-17: 4 mL

## 2017-11-17 MED ORDER — OXYCODONE-ACETAMINOPHEN 5-325 MG PO TABS: 2.0000 | ORAL_TABLET | ORAL | Status: DC | PRN

## 2017-11-17 MED ORDER — FENTANYL CITRATE (PF) 100 MCG/2ML IJ SOLN
50.0000 ug | INTRAMUSCULAR | Status: DC | PRN
  Administered 2017-11-17 (×2): 100 ug via INTRAVENOUS
  Filled 2017-11-17 (×2): qty 2

## 2017-11-17 MED ORDER — METHYLERGONOVINE MALEATE 0.2 MG/ML IJ SOLN: 0.2000 mg | INTRAMUSCULAR | Status: DC | PRN

## 2017-11-17 MED ORDER — SENNOSIDES-DOCUSATE SODIUM 8.6-50 MG PO TABS
2.0000 | ORAL_TABLET | ORAL | Status: DC
  Administered 2017-11-19: 2 via ORAL
  Filled 2017-11-17: qty 2

## 2017-11-17 MED ORDER — WITCH HAZEL-GLYCERIN EX PADS: 1.0000 "application " | MEDICATED_PAD | CUTANEOUS | Status: DC | PRN

## 2017-11-17 MED ORDER — DIPHENHYDRAMINE HCL 25 MG PO CAPS: 25.0000 mg | ORAL_CAPSULE | Freq: Four times a day (QID) | ORAL | Status: DC | PRN

## 2017-11-17 MED ORDER — SIMETHICONE 80 MG PO CHEW: 80.0000 mg | CHEWABLE_TABLET | ORAL | Status: DC | PRN

## 2017-11-17 MED ORDER — METHYLERGONOVINE MALEATE 0.2 MG PO TABS: 0.2000 mg | ORAL_TABLET | ORAL | Status: DC | PRN

## 2017-11-17 MED ORDER — ZOLPIDEM TARTRATE 5 MG PO TABS: 5.0000 mg | ORAL_TABLET | Freq: Every evening | ORAL | Status: DC | PRN

## 2017-11-17 MED ORDER — ONDANSETRON HCL 4 MG/2ML IJ SOLN: 4.0000 mg | INTRAMUSCULAR | Status: DC | PRN

## 2017-11-17 MED ORDER — EPHEDRINE 5 MG/ML INJ
10.0000 mg | INTRAVENOUS | Status: DC | PRN
  Filled 2017-11-17: qty 2

## 2017-11-17 MED ORDER — DIBUCAINE 1 % RE OINT: 1.0000 "application " | TOPICAL_OINTMENT | RECTAL | Status: DC | PRN

## 2017-11-17 MED ORDER — BENZOCAINE-MENTHOL 20-0.5 % EX AERO
1.0000 "application " | INHALATION_SPRAY | CUTANEOUS | Status: DC | PRN
  Administered 2017-11-17: 1 via TOPICAL
  Filled 2017-11-17: qty 56

## 2017-11-17 MED ORDER — PREDNISONE 10 MG PO TABS
10.0000 mg | ORAL_TABLET | Freq: Every day | ORAL | Status: DC
  Administered 2017-11-18 – 2017-11-19 (×2): 10 mg via ORAL
  Filled 2017-11-17 (×4): qty 1

## 2017-11-17 MED ORDER — IBUPROFEN 600 MG PO TABS: 600.0000 mg | ORAL_TABLET | Freq: Four times a day (QID) | ORAL | Status: DC

## 2017-11-17 MED ORDER — PHENYLEPHRINE 40 MCG/ML (10ML) SYRINGE FOR IV PUSH (FOR BLOOD PRESSURE SUPPORT)
80.0000 ug | PREFILLED_SYRINGE | INTRAVENOUS | Status: DC | PRN
  Filled 2017-11-17: qty 5
  Filled 2017-11-17: qty 10

## 2017-11-17 MED ORDER — ONDANSETRON HCL 4 MG PO TABS: 4.0000 mg | ORAL_TABLET | ORAL | Status: DC | PRN

## 2017-11-17 MED ORDER — TETANUS-DIPHTH-ACELL PERTUSSIS 5-2.5-18.5 LF-MCG/0.5 IM SUSP: 0.5000 mL | Freq: Once | INTRAMUSCULAR | Status: DC

## 2017-11-17 MED ORDER — PHENYLEPHRINE 40 MCG/ML (10ML) SYRINGE FOR IV PUSH (FOR BLOOD PRESSURE SUPPORT)
80.0000 ug | PREFILLED_SYRINGE | INTRAVENOUS | Status: DC | PRN
  Filled 2017-11-17: qty 5

## 2017-11-17 MED ORDER — DIPHENHYDRAMINE HCL 50 MG/ML IJ SOLN: 12.5000 mg | INTRAMUSCULAR | Status: DC | PRN

## 2017-11-17 MED ORDER — ACETAMINOPHEN 325 MG PO TABS
650.0000 mg | ORAL_TABLET | ORAL | Status: DC | PRN
  Administered 2017-11-17 – 2017-11-18 (×2): 650 mg via ORAL
  Filled 2017-11-17 (×4): qty 2

## 2017-11-17 MED ORDER — FENTANYL 2.5 MCG/ML BUPIVACAINE 1/10 % EPIDURAL INFUSION (WH - ANES)
14.0000 mL/h | INTRAMUSCULAR | Status: DC | PRN
  Administered 2017-11-17: 14 mL/h via EPIDURAL
  Filled 2017-11-17: qty 100

## 2017-11-17 MED ORDER — COCONUT OIL OIL
1.0000 "application " | TOPICAL_OIL | Status: DC | PRN
  Administered 2017-11-18: 1 via TOPICAL
  Filled 2017-11-17: qty 120

## 2017-11-17 MED ORDER — OXYCODONE-ACETAMINOPHEN 5-325 MG PO TABS: 1.0000 | ORAL_TABLET | ORAL | Status: DC | PRN

## 2017-11-17 MED ORDER — LACTATED RINGERS IV SOLN: 500.0000 mL | Freq: Once | INTRAVENOUS | Status: DC

## 2017-11-17 NOTE — Anesthesia Preprocedure Evaluation (Signed)
Anesthesia Evaluation  Patient identified by MRN, date of birth, ID band Patient awake    Reviewed: Allergy & Precautions, H&P , Patient's Chart, lab work & pertinent test results  Airway Mallampati: II  TM Distance: >3 FB Neck ROM: full    Dental  (+) Teeth Intact   Pulmonary    breath sounds clear to auscultation       Cardiovascular  Rhythm:regular Rate:Normal     Neuro/Psych    GI/Hepatic   Endo/Other    Renal/GU      Musculoskeletal   Abdominal   Peds  Hematology   Anesthesia Other Findings    plts 105; we'll recheck before pulling catheter   Reproductive/Obstetrics (+) Pregnancy                             Anesthesia Physical Anesthesia Plan  ASA: II  Anesthesia Plan: Epidural   Post-op Pain Management:    Induction:   PONV Risk Score and Plan:   Airway Management Planned:   Additional Equipment:   Intra-op Plan:   Post-operative Plan:   Informed Consent: I have reviewed the patients History and Physical, chart, labs and discussed the procedure including the risks, benefits and alternatives for the proposed anesthesia with the patient or authorized representative who has indicated his/her understanding and acceptance.   Dental Advisory Given  Plan Discussed with:   Anesthesia Plan Comments: (Labs checked- platelets confirmed with RN in room. Fetal heart tracing, per RN, reported to be stable enough for sitting procedure. Discussed epidural, and patient consents to the procedure:  included risk of possible headache,backache, failed block, allergic reaction, and nerve injury. This patient was asked if she had any questions or concerns before the procedure started.)        Anesthesia Quick Evaluation

## 2017-11-17 NOTE — Progress Notes (Signed)
Informed Dr Cristela BlueKyle Jackson of platelet count of 98 from CBC drawn post delivery. Given order to remove epidural

## 2017-11-17 NOTE — Anesthesia Pain Management Evaluation Note (Signed)
  CRNA Pain Management Visit Note  Patient: Pamela Stephens, 32 y.o., female  "Hello I am a member of the anesthesia team at [facility]. We have an anesthesia team available at all times to provide care throughout the hospital, including epidural management and anesthesia for C-section. I don't know your plan for the delivery whether it a natural birth, water birth, IV sedation, nitrous supplementation, doula or epidural, but we want to meet your pain goals."   1.Was your pain managed to your expectations on prior hospitalizations?   Yes   2.What is your expectation for pain management during this hospitalization?     Epidural  3.How can we help you reach that goal? Pt currently waiting on ANMD  Record the patient's initial score and the patient's pain goal.   Pain: 8  Pain Goal: 0 [facility] wants you to be able to say your pain was always managed very well.  Elynore Dolinski 11/17/2017

## 2017-11-17 NOTE — Anesthesia Procedure Notes (Signed)
Epidural Patient location during procedure: OB  Staffing Anesthesiologist: Eugean Arnott, MD  Preanesthetic Checklist Completed: patient identified, pre-op evaluation, timeout performed, IV checked, risks and benefits discussed and monitors and equipment checked  Epidural Patient position: sitting Prep: DuraPrep Patient monitoring: blood pressure and continuous pulse ox Approach: right paramedian Location: L3-L4 Injection technique: LOR air  Needle:  Needle type: Tuohy  Needle gauge: 17 G Needle insertion depth: 4 cm Catheter type: closed end flexible Catheter size: 19 Gauge Catheter at skin depth: 10 cm Test dose: negative  Assessment Sensory level: T8  Additional Notes  Dosing of Epidural:  1st dose, through catheter .............................................  Xylocaine 40 mg  2nd dose, through catheter, after waiting 3 minutes.........Xylocaine 60 mg    As each dose occurred, patient was free of IV sx; and patient exhibited no evidence of SA injection.  Patient is more comfortable after epidural dosed. Please see RN's note for documentation of vital signs,and FHR which are stable.  Patient reminded not to try to ambulate with numb legs, and that an RN must be present when she attempts to get up.           

## 2017-11-18 ENCOUNTER — Encounter (HOSPITAL_COMMUNITY): Payer: Self-pay

## 2017-11-18 LAB — CBC
HCT: 35.9 % — ABNORMAL LOW (ref 36.0–46.0)
Hemoglobin: 12.4 g/dL (ref 12.0–15.0)
MCH: 29.7 pg (ref 26.0–34.0)
MCHC: 34.5 g/dL (ref 30.0–36.0)
MCV: 86.1 fL (ref 78.0–100.0)
Platelets: 85 10*3/uL — ABNORMAL LOW (ref 150–400)
RBC: 4.17 MIL/uL (ref 3.87–5.11)
RDW: 15.3 % (ref 11.5–15.5)
WBC: 15.5 10*3/uL — ABNORMAL HIGH (ref 4.0–10.5)

## 2017-11-18 MED ORDER — ACETAMINOPHEN 500 MG PO TABS
1000.0000 mg | ORAL_TABLET | Freq: Four times a day (QID) | ORAL | Status: DC | PRN
  Administered 2017-11-18 – 2017-11-19 (×3): 1000 mg via ORAL
  Filled 2017-11-18 (×3): qty 2

## 2017-11-18 NOTE — Lactation Note (Addendum)
This note was copied from a baby's chart. Lactation Consultation Note Baby 14 hrs old. Hasn't BF since after delivery. Baby is sleepy, will cry d/t fussy. Cueing when LC in rm. Mom has heavy round bouncey breast. Short shaft nipple. Hand expression glistening after several minutes hand expression and breast massage. Mom's breast are tender. Baby cueing. Assisted to breast in football hold to Rt. Breast. Mom stated breast tender. Areola and nipple compressible. Baby will not maintain latch. Baby sucked a few times.  Discussed newborn feeding habits and behaviors, STS, I&O, cluster feeding, supply and demand.  Baby rooted, latched a few times but no feeding occurred. Baby arching and fussy as if may in pain. Passing gas. Educated mom on this.  Gave mom shells to evert nipples to obtain a deeper latch. Hand pump given to pre-pump prior to latching. Encouraged mom to wear shells.   Mom encouraged to feed baby 8-12 times/24 hours and with feeding cues. Mom encouraged to waken baby for feeds. Alert RN if baby doesn't start feeding w/in next couple of feedings. Encouraged mom to pump if baby not feeding for stimulation. If baby hasn't cue in 2-3 hours, stimulate baby to feed.  Call for assistance or questions. WH/LC brochure given w/resources, support groups and LC services.  Patient Name: Pamela Stephens [id number] Date: 11/18/2017 Reason for consult: Initial assessment   Maternal Data Has patient been taught Hand Expression?: Yes Does the patient have breastfeeding experience prior to this delivery?: No  Feeding Feeding Type: Breast Fed Length of feed: 0 min  LATCH Score Latch: Too sleepy or reluctant, no latch achieved, no sucking elicited.  Audible Swallowing: None  Type of Nipple: Everted at rest and after stimulation(short shaft)  Comfort (Breast/Nipple): Soft / non-tender  Hold (Positioning): Full assist, staff holds infant at breast  LATCH Score:  4  Interventions Interventions: Breast feeding basics reviewed;Support pillows;Assisted with latch;Position options;Skin to skin;Expressed milk;Breast massage;Coconut oil;Hand express;Shells;Pre-pump if needed;Hand pump;Breast compression;Adjust position  Lactation Tools Discussed/Used Tools: Shells;Pump Shell Type: Inverted Breast pump type: Manual WIC Program: No Pump Review: Setup, frequency, and cleaning;Milk Storage Initiated by:: Peri Jefferson RN IBCLC Date initiated:: 11/18/17   Consult Status Consult Status: Follow-up Date: 11/18/17 Follow-up type: In-patient    Charyl Dancer 11/18/2017, 5:34 AM

## 2017-11-18 NOTE — Lactation Note (Signed)
This note was copied from a baby's chart. Lactation Consultation Note; Mother has had several attempts with breastfeeding today. Infant breastfed at delivery but has not breastfed since.  Assist mother with skin to skin. Assist mother with hand expressing and observed a tiny drop on the rt nipple.  Infant tugged a few times on a gloved finger. Infant began cuing and rooting. Infant latched on with good rhythmic suckling for 15-20 mins. Observed a few swallows. Unable to get enough colostrum to spoon feed.  Advised mother to hand express , use hand pump after feeding for 15 mins on each breast. Encouraged mother to call staff for assistance if unable to get infant latched.  Advised mother to do frequent skin to skin. Advised mother to continue to breastfeed 8-12 times in 24 hours. Mother receptive to all teaching.  Patient Name: Pamela Stephens'X Date: 11/18/2017 Reason for consult: Follow-up assessment   Maternal Data    Feeding Feeding Type: Breast Fed Length of feed: 20 min  LATCH Score Latch: Repeated attempts needed to sustain latch, nipple held in mouth throughout feeding, stimulation needed to elicit sucking reflex.  Audible Swallowing: A few with stimulation  Type of Nipple: Everted at rest and after stimulation  Comfort (Breast/Nipple): Soft / non-tender  Hold (Positioning): Assistance needed to correctly position infant at breast and maintain latch.  LATCH Score: 7  Interventions Interventions: Assisted with latch;Skin to skin;Breast massage;Hand express;Breast compression;Adjust position;Support pillows;Position options;Hand pump  Lactation Tools Discussed/Used     Consult Status Consult Status: Follow-up Date: 11/19/17 Follow-up type: In-patient    Stevan Born [facility] 11/18/2017, 4:37 PM

## 2017-11-18 NOTE — Progress Notes (Signed)
PPD # 1 SVD Information for the patient's newborn:  Jennalyn, Cawley [[id number]]  female      breast feeding  / Circumcision likely not, only if medically indicated per peds exam - pending Baby name: no name yet  S:  Reports feeling tired but well             Tolerating po/ No nausea or vomiting             Bleeding is light             Pain controlled with acetaminophen             Up ad lib / ambulatory / voiding without difficulties        O:  A & O x 3, in no apparent distress              VS:  Vitals:   11/17/17 1745 11/17/17 1850 11/17/17 2245 11/18/17 0615  BP: (!) 149/98 134/87 (!) 137/96 123/88  Pulse: 82 91 81 68  Resp: Temp: 98.7 F (37.1 C) 98.9 F (37.2 C) 98.2 F (36.8 C) 98 F (36.7 C)  TempSrc: Oral Oral Oral Oral  SpO2: 100% 100% 100% 99%  Weight:      Height:        LABS:  Recent Labs    11/17/17 1546 11/18/17 0734  WBC 13.6* 15.5*  HGB 13.8 12.4  HCT 40.8 35.9*  PLT 98* 85*    Blood type: --/--/B POS, B POS Performed at [facility], [address], [place], [place] [address]  (04/25 2040)  Rubella: Immune (09/26 0000)   I&O: I/O last 3 completed shifts: In: -  Out: 1100 [Urine:700; Blood:400]          No intake/output data recorded.  Lungs: Clear and unlabored  Heart: regular rate and rhythm / no murmurs  Abdomen: soft, non-tender, non-distended             Fundus: firm, non-tender, U-1  Perineum: no edema, repair intact  Lochia: small  Extremities: no edema, no calf pain or tenderness    A/P: PPD # 1 31 y.o., G1P1001   Principal Problem:   SVD (spontaneous vaginal delivery) 4/25 Active Problems:   Thrombocytopenia affecting pregnancy, antepartum (HCC)  - platelets with small drop since yesterday, asymptomatic  - continue oral prednisone as established; 10 mg PO daily  - DC NSAID's until platelets > 100  - continue to monitor closely, rpt CBC in AM    IUGR (intrauterine growth restriction) affecting care  of mother   Postpartum care following vaginal delivery    Second-degree perineal laceration, with delivery   Doing well - stable status  Routine post partum orders  Anticipate discharge tomorrow    Neta Mends, MSN, CNM 11/18/2017, 8:38 AM

## 2017-11-18 NOTE — Anesthesia Postprocedure Evaluation (Signed)
Anesthesia Post Note  Patient: Pamela Stephens  Procedure(s) Performed: AN AD HOC LABOR EPIDURAL     Patient location during evaluation: Mother Baby Anesthesia Type: Epidural Level of consciousness: awake and alert Pain management: pain level controlled Vital Signs Assessment: post-procedure vital signs reviewed and stable Respiratory status: spontaneous breathing, nonlabored ventilation and respiratory function stable Cardiovascular status: stable Postop Assessment: no headache, no backache, epidural receding and patient able to bend at knees Anesthetic complications: no    Last Vitals:  Vitals:   11/17/17 2245 11/18/17 0615  BP: (!) 137/96 123/88  Pulse: 81 68  Resp: 16 16  Temp: 36.8 C 36.7 C  SpO2: 100% 99%    Last Pain:  Vitals:   11/18/17 0719  TempSrc:   PainSc: 5    Pain Goal: Patients Stated Pain Goal: 2 (11/17/17 0100)               Rica Records

## 2017-11-19 LAB — CBC
HCT: 35.7 % — ABNORMAL LOW (ref 36.0–46.0)
Hemoglobin: 12.1 g/dL (ref 12.0–15.0)
MCH: 29.7 pg (ref 26.0–34.0)
MCHC: 33.9 g/dL (ref 30.0–36.0)
MCV: 87.5 fL (ref 78.0–100.0)
Platelets: 89 10*3/uL — ABNORMAL LOW (ref 150–400)
RBC: 4.08 MIL/uL (ref 3.87–5.11)
RDW: 15.5 % (ref 11.5–15.5)
WBC: 13.2 10*3/uL — ABNORMAL HIGH (ref 4.0–10.5)

## 2017-11-19 MED ORDER — BENZOCAINE-MENTHOL 20-0.5 % EX AERO: 1.0000 "application " | INHALATION_SPRAY | CUTANEOUS | Status: DC | PRN

## 2017-11-19 MED ORDER — ACETAMINOPHEN 500 MG PO TABS: 1000.0000 mg | ORAL_TABLET | Freq: Four times a day (QID) | ORAL | 0 refills | Status: DC | PRN

## 2017-11-19 MED ORDER — COCONUT OIL OIL: 1.0000 "application " | TOPICAL_OIL | 0 refills | Status: DC | PRN

## 2017-11-19 NOTE — Lactation Note (Signed)
This note was copied from a baby's chart. Lactation Consultation Note  Patient Name: Pamela Stephens [id number] Date: 11/19/2017   Visited with Mom on day of discharge, baby 26 hrs old.  Weight today is 5 lbs 11.4 oz and at 7% weight loss.  Mom is opting to feed baby formula every 3rd feeding.  Mom states baby is latching much better, and staying on breast longer.  Mom denies pinching, but nipples have transient soreness.  Mom using coconut oil on her nipples.    Baby had just fed, and Mom trying to eat her breakfast.  Offered to assist/assess at next feeding before she is discharged.  Mom states she is seeing the Pediatrician's Lactation Consultant tomorrow.    Encouraged STS, and feeding baby often on cue.  Recommended 8-12 feedings per 24 hrs.  If Mom feeds baby formula, to pump 15-20 mins.   Also, due to baby being <6 lbs, recommended pumping after breastfeeding 4-6 times per 24 hrs.  To spoon feed any colostrum baby to baby. Mom has a Medela DEBP at home.    Engorgement prevention and treatment discussed.  Mom aware of OP lactation support available. Encouraged to call prn.   Judee Clara 11/19/2017, 11:31 AM

## 2017-11-19 NOTE — Progress Notes (Signed)
Post Partum Day #2           Information for the patient's newborn:  Pamela Stephens, Pamela Stephens [[id number]]  female     circumcision declines Baby name: Vishraav Feeding: breast  Subjective: No HA, SOB, CP, F/C, breast symptoms. Pain minimal. Normal vaginal bleeding, no clots.      Objective:  VS:  Vitals:   11/18/17 0615 11/18/17 1015 11/18/17 1854 11/19/17 0600  BP: 123/88 (!) 124/92 121/88 (!) 124/97  Pulse: 68 91 92 86  Resp: Temp: 98 F (36.7 C) 97.8 F (36.6 C) 98.4 F (36.9 C) 97.8 F (36.6 C)  TempSrc: Oral Oral Oral Oral  SpO2: 99% 100% 98%   Weight:      Height:        No intake or output data in the 24 hours ending 11/19/17 1027    Recent Labs    11/18/17 0734 11/19/17 0525  WBC 15.5* 13.2*  HGB 12.4 12.1  HCT 35.9* 35.7*  PLT 85* 89*    Blood type: --/--/B POS, B POS Performed at [facility], [address], [place], [place] [address]  (04/25 2040) Rubella: Immune (09/26 0000)    Physical Exam:  General: alert, cooperative and no distress Uterine Fundus: firm Lochia: appropriate Perineum: repair intact, edema none DVT Evaluation: No cords or calf tenderness. No significant calf/ankle edema.    Assessment/Plan: PPD # 2 / 32 y.o., G1P1001 S/P:spontaneous vaginal   Principal Problem:   SVD (spontaneous vaginal delivery) 4/25 Active Problems:   Thrombocytopenia affecting pregnancy, antepartum (HCC)  - continue Prednisone 10 mg daily x 1 week  - F/U outpatient w/ hematology consult Labile BP  - mild elevated range occasionally, no neural s/sx of PEC  - BP check 1 week at home by home health RN  - PEC precautions given    IUGR (intrauterine growth restriction) affecting care of mother   Postpartum care following vaginal delivery   Second-degree perineal laceration, with delivery    normal postpartum exam  Continue current postpartum care             DC home today w/ instructions  F/U at [facility] in 6 weeks and  PRN   LOS: 3 days   Neta Mends, CNM, MSN 11/19/2017, 10:27 AM

## 2017-11-19 NOTE — Discharge Summary (Signed)
Obstetric Discharge Summary Reason for Admission: onset of labor Prenatal Procedures: NST and ultrasound Intrapartum Procedures: spontaneous vaginal delivery and epidural Postpartum Procedures: none Complications-Operative and Postpartum: 2nd degree perineal laceration Hemoglobin  Date Value Ref Range Status  11/19/2017 12.1 12.0 - 15.0 g/dL Final   HCT  Date Value Ref Range Status  11/19/2017 35.7 (L) 36.0 - 46.0 % Final    Physical Exam:  General: alert, cooperative and no distress Lochia: appropriate Uterine Fundus: firm Incision: healing well DVT Evaluation: No cords or calf tenderness. No significant calf/ankle edema.  Discharge Diagnoses:  Patient Active Problem List   Diagnosis Date Noted  . SVD (spontaneous vaginal delivery) 4/25 11/17/2017  . IUGR (intrauterine growth restriction) affecting care of mother 11/17/2017  . Postpartum care following vaginal delivery 11/17/2017  . Second-degree perineal laceration, with delivery 11/17/2017  . Thrombocytopenia affecting pregnancy, antepartum (HCC) 09/04/2017    Discharge Information: Date: 11/19/2017 Activity: pelvic rest Diet: routine Medications:  Allergies as of 11/19/2017   No Known Allergies     Medication List    TAKE these medications   acetaminophen 500 MG tablet Commonly known as:  TYLENOL Take 2 tablets (1,000 mg total) by mouth every 6 (six) hours as needed (for pain scale < 4).   benzocaine-Menthol 20-0.5 % Aero Commonly known as:  DERMOPLAST Apply 1 application topically as needed for irritation (perineal discomfort).   coconut oil Oil Apply 1 application topically as needed.   predniSONE 5 MG tablet Commonly known as:  DELTASONE Take 2 tablets (10 mg total) by mouth daily with breakfast.   prenatal multivitamin Tabs tablet Take 1 tablet by mouth daily at 12 noon.            Discharge Care Instructions  (From admission, onward)        Start     Ordered   11/19/17 0000  Discharge  wound care:    Comments:  Sitz baths 2 times /day with warm water x 1 week   11/19/17 1033     Condition: stable Instructions: refer to practice specific booklet Discharge to: home Follow-up Information    Olivia Mackie, MD. Schedule an appointment as soon as possible for a visit in 6 week(s).   Specialty:  Obstetrics and Gynecology Contact information: [address] [place] [address] [phone]           Newborn Data: Live born female Vishraav Birth Weight: 6 lb 2.4 oz (2790 g) APGAR: 9, 9  Newborn Delivery   Birth date/time:  11/17/2017 14:57:00 Delivery type:  Vaginal, Spontaneous     Home with mother.  Neta Mends, CNM 11/19/2017, 10:34 AM

## 2017-12-05 ENCOUNTER — Other Ambulatory Visit (INDEPENDENT_AMBULATORY_CARE_PROVIDER_SITE_OTHER): Payer: Managed Care, Other (non HMO)

## 2017-12-05 DIAGNOSIS — O99119 Other diseases of the blood and blood-forming organs and certain disorders involving the immune mechanism complicating pregnancy, unspecified trimester: Secondary | ICD-10-CM

## 2017-12-05 DIAGNOSIS — D696 Thrombocytopenia, unspecified: Secondary | ICD-10-CM | POA: Diagnosis not present

## 2017-12-05 DIAGNOSIS — O0990 Supervision of high risk pregnancy, unspecified, unspecified trimester: Secondary | ICD-10-CM

## 2017-12-05 LAB — CBC WITH DIFFERENTIAL/PLATELET
Abs Immature Granulocytes: 0 10*3/uL (ref 0.0–0.1)
Basophils Absolute: 0.1 10*3/uL (ref 0.0–0.1)
Basophils Relative: 1 %
Eosinophils Absolute: 0.1 10*3/uL (ref 0.0–0.7)
Eosinophils Relative: 1 %
HCT: 43.9 % (ref 36.0–46.0)
Hemoglobin: 14.1 g/dL (ref 12.0–15.0)
Immature Granulocytes: 0 %
Lymphocytes Relative: 30 %
Lymphs Abs: 2.6 10*3/uL (ref 0.7–4.0)
MCH: 28.5 pg (ref 26.0–34.0)
MCHC: 32.1 g/dL (ref 30.0–36.0)
MCV: 88.7 fL (ref 78.0–100.0)
Monocytes Absolute: 0.7 10*3/uL (ref 0.1–1.0)
Monocytes Relative: 8 %
Neutro Abs: 5.1 10*3/uL (ref 1.7–7.7)
Neutrophils Relative %: 60 %
Platelets: 370 10*3/uL (ref 150–400)
RBC: 4.95 MIL/uL (ref 3.87–5.11)
RDW: 14.6 % (ref 11.5–15.5)
WBC: 8.6 10*3/uL (ref 4.0–10.5)

## 2017-12-06 ENCOUNTER — Telehealth: Payer: Self-pay | Admitting: *Deleted

## 2017-12-06 NOTE — Telephone Encounter (Signed)
Pt called / informed "check CBC in 1 month" per Dr Cyndie Chime. Stated she will call back to schedule date/time.

## 2017-12-06 NOTE — Telephone Encounter (Signed)
Let's check a cbc in about 1 month

## 2017-12-06 NOTE — Telephone Encounter (Signed)
Pt called / informed "platelets now normal at 370,000. She should be off all prednisone at this point. Congrats on new baby" per Dr Cyndie Chime. Stated she will stop taking Prednisone and wanted to know if she needs any f/u labs?

## 2017-12-06 NOTE — Telephone Encounter (Signed)
-----   Message from Levert Feinstein, MD sent at 12/05/2017  2:22 PM EDT ----- Call pt: platelets now normal at 370,000. She should be off all prednisone at this point. Congrats on new baby

## 2018-09-24 ENCOUNTER — Encounter: Payer: Self-pay | Admitting: *Deleted

## 2019-10-10 LAB — OB RESULTS CONSOLE ANTIBODY SCREEN: Antibody Screen: NEGATIVE

## 2019-10-10 LAB — OB RESULTS CONSOLE HIV ANTIBODY (ROUTINE TESTING): HIV: NONREACTIVE

## 2019-10-10 LAB — OB RESULTS CONSOLE ABO/RH: RH Type: POSITIVE

## 2019-10-10 LAB — OB RESULTS CONSOLE RUBELLA ANTIBODY, IGM: Rubella: NON-IMMUNE/NOT IMMUNE

## 2019-10-10 LAB — OB RESULTS CONSOLE RPR: RPR: NONREACTIVE

## 2019-10-10 LAB — OB RESULTS CONSOLE HEPATITIS B SURFACE ANTIGEN: Hepatitis B Surface Ag: NEGATIVE

## 2020-05-05 ENCOUNTER — Encounter (HOSPITAL_COMMUNITY)
Admission: AD | Disposition: A | Discharge status: Home / Self Care | Payer: Self-pay | Source: Home / Self Care | Attending: Obstetrics and Gynecology

## 2020-05-05 ENCOUNTER — Inpatient Hospital Stay (HOSPITAL_COMMUNITY): Payer: Managed Care, Other (non HMO) | Admitting: Anesthesiology

## 2020-05-05 ENCOUNTER — Telehealth (HOSPITAL_COMMUNITY): Payer: Self-pay | Admitting: *Deleted

## 2020-05-05 ENCOUNTER — Inpatient Hospital Stay (HOSPITAL_COMMUNITY)
Admission: AD | Admit: 2020-05-05 | Discharge: 2020-05-08 | DRG: 787 | Disposition: A | Discharge status: Home / Self Care | Payer: Managed Care, Other (non HMO) | Attending: Obstetrics and Gynecology | Admitting: Obstetrics and Gynecology

## 2020-05-05 ENCOUNTER — Other Ambulatory Visit: Payer: Self-pay | Admitting: Obstetrics and Gynecology

## 2020-05-05 ENCOUNTER — Encounter (HOSPITAL_COMMUNITY): Payer: Self-pay | Admitting: Obstetrics

## 2020-05-05 ENCOUNTER — Encounter (HOSPITAL_COMMUNITY): Payer: Self-pay | Admitting: *Deleted

## 2020-05-05 DIAGNOSIS — Z20822 Contact with and (suspected) exposure to covid-19: Secondary | ICD-10-CM | POA: Diagnosis present

## 2020-05-05 DIAGNOSIS — D696 Thrombocytopenia, unspecified: Secondary | ICD-10-CM | POA: Diagnosis present

## 2020-05-05 DIAGNOSIS — Z23 Encounter for immunization: Secondary | ICD-10-CM | POA: Diagnosis not present

## 2020-05-05 DIAGNOSIS — O322XX0 Maternal care for transverse and oblique lie, not applicable or unspecified: Principal | ICD-10-CM | POA: Diagnosis present

## 2020-05-05 DIAGNOSIS — O321XX0 Maternal care for breech presentation, not applicable or unspecified: Secondary | ICD-10-CM | POA: Diagnosis present

## 2020-05-05 DIAGNOSIS — Z3A39 39 weeks gestation of pregnancy: Secondary | ICD-10-CM

## 2020-05-05 DIAGNOSIS — D6959 Other secondary thrombocytopenia: Secondary | ICD-10-CM | POA: Diagnosis present

## 2020-05-05 DIAGNOSIS — O9912 Other diseases of the blood and blood-forming organs and certain disorders involving the immune mechanism complicating childbirth: Secondary | ICD-10-CM | POA: Diagnosis present

## 2020-05-05 DIAGNOSIS — O328XX0 Maternal care for other malpresentation of fetus, not applicable or unspecified: Secondary | ICD-10-CM | POA: Diagnosis present

## 2020-05-05 LAB — CBC
HCT: 44.6 % (ref 36.0–46.0)
Hemoglobin: 14.3 g/dL (ref 12.0–15.0)
MCH: 29.9 pg (ref 26.0–34.0)
MCHC: 32.1 g/dL (ref 30.0–36.0)
MCV: 93.3 fL (ref 80.0–100.0)
Platelets: 125 10*3/uL — ABNORMAL LOW (ref 150–400)
RBC: 4.78 MIL/uL (ref 3.87–5.11)
RDW: 14 % (ref 11.5–15.5)
WBC: 13.5 10*3/uL — ABNORMAL HIGH (ref 4.0–10.5)
nRBC: 0 % (ref 0.0–0.2)

## 2020-05-05 LAB — TYPE AND SCREEN
ABO/RH(D): B POS
Antibody Screen: NEGATIVE

## 2020-05-05 LAB — RESPIRATORY PANEL BY RT PCR (FLU A&B, COVID)
Influenza A by PCR: NEGATIVE
Influenza B by PCR: NEGATIVE
SARS Coronavirus 2 by RT PCR: NEGATIVE

## 2020-05-05 SURGERY — Surgical Case
Anesthesia: General | Wound class: Clean Contaminated

## 2020-05-05 MED ORDER — AMISULPRIDE (ANTIEMETIC) 5 MG/2ML IV SOLN
10.0000 mg | Freq: Once | INTRAVENOUS | Status: DC | PRN
  Filled 2020-05-05: qty 4

## 2020-05-05 MED ORDER — PROPOFOL 10 MG/ML IV BOLUS
INTRAVENOUS | Status: DC | PRN
  Administered 2020-05-05: 180 mg via INTRAVENOUS
  Administered 2020-05-05: 20 mg via INTRAVENOUS

## 2020-05-05 MED ORDER — DEXMEDETOMIDINE (PRECEDEX) IN NS 20 MCG/5ML (4 MCG/ML) IV SYRINGE
PREFILLED_SYRINGE | INTRAVENOUS | Status: DC | PRN
  Administered 2020-05-05: 4 ug via INTRAVENOUS

## 2020-05-05 MED ORDER — OXYCODONE-ACETAMINOPHEN 5-325 MG PO TABS
1.0000 | ORAL_TABLET | ORAL | Status: DC | PRN
  Administered 2020-05-06 – 2020-05-07 (×3): 1 via ORAL
  Filled 2020-05-05 (×3): qty 1

## 2020-05-05 MED ORDER — METHYLERGONOVINE MALEATE 0.2 MG/ML IJ SOLN
INTRAMUSCULAR | Status: AC
  Filled 2020-05-05: qty 1

## 2020-05-05 MED ORDER — IBUPROFEN 800 MG PO TABS
800.0000 mg | ORAL_TABLET | Freq: Four times a day (QID) | ORAL | Status: DC
  Administered 2020-05-07 – 2020-05-08 (×6): 800 mg via ORAL
  Filled 2020-05-05 (×7): qty 1

## 2020-05-05 MED ORDER — FAMOTIDINE IN NACL 20-0.9 MG/50ML-% IV SOLN
INTRAVENOUS | Status: AC
  Filled 2020-05-05: qty 50

## 2020-05-05 MED ORDER — PROPOFOL 10 MG/ML IV BOLUS
INTRAVENOUS | Status: AC
  Filled 2020-05-05: qty 20

## 2020-05-05 MED ORDER — CEFAZOLIN SODIUM-DEXTROSE 2-4 GM/100ML-% IV SOLN
INTRAVENOUS | Status: AC
  Filled 2020-05-05: qty 100

## 2020-05-05 MED ORDER — OXYTOCIN-SODIUM CHLORIDE 30-0.9 UT/500ML-% IV SOLN
INTRAVENOUS | Status: DC | PRN
  Administered 2020-05-05: 30 [IU] via INTRAVENOUS

## 2020-05-05 MED ORDER — OXYTOCIN-SODIUM CHLORIDE 30-0.9 UT/500ML-% IV SOLN
INTRAVENOUS | Status: AC
  Filled 2020-05-05: qty 500

## 2020-05-05 MED ORDER — LACTATED RINGERS IV BOLUS
1000.0000 mL | Freq: Once | INTRAVENOUS | Status: AC
  Administered 2020-05-05: 1000 mL via INTRAVENOUS

## 2020-05-05 MED ORDER — SIMETHICONE 80 MG PO CHEW: 80.0000 mg | CHEWABLE_TABLET | ORAL | Status: DC | PRN

## 2020-05-05 MED ORDER — METHYLERGONOVINE MALEATE 0.2 MG PO TABS: 0.2000 mg | ORAL_TABLET | ORAL | Status: DC | PRN

## 2020-05-05 MED ORDER — PRENATAL MULTIVITAMIN CH
1.0000 | ORAL_TABLET | Freq: Every day | ORAL | Status: DC
  Administered 2020-05-06 – 2020-05-08 (×3): 1 via ORAL
  Filled 2020-05-05 (×3): qty 1

## 2020-05-05 MED ORDER — CEFAZOLIN SODIUM-DEXTROSE 2-3 GM-%(50ML) IV SOLR
INTRAVENOUS | Status: DC | PRN
  Administered 2020-05-05: 2 g via INTRAVENOUS

## 2020-05-05 MED ORDER — ONDANSETRON HCL 4 MG/2ML IJ SOLN: 4.0000 mg | Freq: Once | INTRAMUSCULAR | Status: DC | PRN

## 2020-05-05 MED ORDER — FAMOTIDINE IN NACL 20-0.9 MG/50ML-% IV SOLN
20.0000 mg | Freq: Once | INTRAVENOUS | Status: AC
  Administered 2020-05-05: 20 mg via INTRAVENOUS

## 2020-05-05 MED ORDER — OXYTOCIN-SODIUM CHLORIDE 30-0.9 UT/500ML-% IV SOLN
2.5000 [IU]/h | INTRAVENOUS | Status: AC
  Administered 2020-05-05: 2.5 [IU]/h via INTRAVENOUS

## 2020-05-05 MED ORDER — SIMETHICONE 80 MG PO CHEW
80.0000 mg | CHEWABLE_TABLET | Freq: Three times a day (TID) | ORAL | Status: DC
  Administered 2020-05-06 – 2020-05-08 (×7): 80 mg via ORAL
  Filled 2020-05-05 (×6): qty 1

## 2020-05-05 MED ORDER — HYDROMORPHONE HCL 1 MG/ML IJ SOLN
INTRAMUSCULAR | Status: AC
  Filled 2020-05-05: qty 0.5

## 2020-05-05 MED ORDER — SIMETHICONE 80 MG PO CHEW
80.0000 mg | CHEWABLE_TABLET | ORAL | Status: DC
  Administered 2020-05-06: 80 mg via ORAL
  Filled 2020-05-05 (×2): qty 1

## 2020-05-05 MED ORDER — LACTATED RINGERS IV SOLN: INTRAVENOUS | Status: DC

## 2020-05-05 MED ORDER — DIBUCAINE (PERIANAL) 1 % EX OINT: 1.0000 "application " | TOPICAL_OINTMENT | CUTANEOUS | Status: DC | PRN

## 2020-05-05 MED ORDER — MIDAZOLAM HCL 2 MG/2ML IJ SOLN
INTRAMUSCULAR | Status: AC
  Filled 2020-05-05: qty 2

## 2020-05-05 MED ORDER — SENNOSIDES-DOCUSATE SODIUM 8.6-50 MG PO TABS
2.0000 | ORAL_TABLET | ORAL | Status: DC
  Administered 2020-05-06 – 2020-05-08 (×2): 2 via ORAL
  Filled 2020-05-05 (×2): qty 2

## 2020-05-05 MED ORDER — DEXAMETHASONE SODIUM PHOSPHATE 10 MG/ML IJ SOLN
INTRAMUSCULAR | Status: DC | PRN
  Administered 2020-05-05: 10 mg via INTRAVENOUS

## 2020-05-05 MED ORDER — OXYCODONE HCL 5 MG PO TABS: 5.0000 mg | ORAL_TABLET | Freq: Once | ORAL | Status: DC | PRN

## 2020-05-05 MED ORDER — ONDANSETRON HCL 4 MG/2ML IJ SOLN
INTRAMUSCULAR | Status: AC
  Filled 2020-05-05: qty 2

## 2020-05-05 MED ORDER — WITCH HAZEL-GLYCERIN EX PADS: 1.0000 "application " | MEDICATED_PAD | CUTANEOUS | Status: DC | PRN

## 2020-05-05 MED ORDER — DEXMEDETOMIDINE (PRECEDEX) IN NS 20 MCG/5ML (4 MCG/ML) IV SYRINGE
PREFILLED_SYRINGE | INTRAVENOUS | Status: AC
  Filled 2020-05-05: qty 5

## 2020-05-05 MED ORDER — SUCCINYLCHOLINE CHLORIDE 200 MG/10ML IV SOSY
PREFILLED_SYRINGE | INTRAVENOUS | Status: AC
  Filled 2020-05-05: qty 10

## 2020-05-05 MED ORDER — SOD CITRATE-CITRIC ACID 500-334 MG/5ML PO SOLN: 30.0000 mL | Freq: Once | ORAL | Status: DC

## 2020-05-05 MED ORDER — METHYLERGONOVINE MALEATE 0.2 MG/ML IJ SOLN
INTRAMUSCULAR | Status: DC | PRN
  Administered 2020-05-05: .2 mg via INTRAMUSCULAR

## 2020-05-05 MED ORDER — OXYCODONE HCL 5 MG/5ML PO SOLN: 5.0000 mg | Freq: Once | ORAL | Status: DC | PRN

## 2020-05-05 MED ORDER — LACTATED RINGERS IV SOLN: INTRAVENOUS | Status: DC | PRN

## 2020-05-05 MED ORDER — SODIUM CHLORIDE 0.9 % IR SOLN
Status: DC | PRN
  Administered 2020-05-05: 1

## 2020-05-05 MED ORDER — FENTANYL CITRATE (PF) 250 MCG/5ML IJ SOLN
INTRAMUSCULAR | Status: DC | PRN
  Administered 2020-05-05: 250 ug via INTRAVENOUS

## 2020-05-05 MED ORDER — DEXAMETHASONE SODIUM PHOSPHATE 10 MG/ML IJ SOLN
INTRAMUSCULAR | Status: AC
  Filled 2020-05-05: qty 1

## 2020-05-05 MED ORDER — TETANUS-DIPHTH-ACELL PERTUSSIS 5-2.5-18.5 LF-MCG/0.5 IM SUSP: 0.5000 mL | Freq: Once | INTRAMUSCULAR | Status: DC

## 2020-05-05 MED ORDER — SOD CITRATE-CITRIC ACID 500-334 MG/5ML PO SOLN
ORAL | Status: AC
  Filled 2020-05-05: qty 30

## 2020-05-05 MED ORDER — BUPIVACAINE HCL (PF) 0.25 % IJ SOLN
INTRAMUSCULAR | Status: AC
  Filled 2020-05-05: qty 30

## 2020-05-05 MED ORDER — METHYLERGONOVINE MALEATE 0.2 MG/ML IJ SOLN: 0.2000 mg | INTRAMUSCULAR | Status: DC | PRN

## 2020-05-05 MED ORDER — FENTANYL CITRATE (PF) 250 MCG/5ML IJ SOLN
INTRAMUSCULAR | Status: AC
Start: 2020-05-05 — End: ?
  Filled 2020-05-05: qty 5

## 2020-05-05 MED ORDER — ZOLPIDEM TARTRATE 5 MG PO TABS: 5.0000 mg | ORAL_TABLET | Freq: Every evening | ORAL | Status: DC | PRN

## 2020-05-05 MED ORDER — BUPIVACAINE HCL (PF) 0.25 % IJ SOLN
INTRAMUSCULAR | Status: DC | PRN
  Administered 2020-05-05: 30 mL

## 2020-05-05 MED ORDER — ONDANSETRON HCL 4 MG/2ML IJ SOLN
INTRAMUSCULAR | Status: DC | PRN
  Administered 2020-05-05: 4 mg via INTRAVENOUS

## 2020-05-05 MED ORDER — KETOROLAC TROMETHAMINE 30 MG/ML IJ SOLN: 30.0000 mg | Freq: Once | INTRAMUSCULAR | Status: DC | PRN

## 2020-05-05 MED ORDER — KETOROLAC TROMETHAMINE 30 MG/ML IJ SOLN
30.0000 mg | Freq: Four times a day (QID) | INTRAMUSCULAR | Status: AC
  Administered 2020-05-06 (×4): 30 mg via INTRAVENOUS
  Filled 2020-05-05 (×5): qty 1

## 2020-05-05 MED ORDER — DIPHENHYDRAMINE HCL 25 MG PO CAPS: 25.0000 mg | ORAL_CAPSULE | Freq: Four times a day (QID) | ORAL | Status: DC | PRN

## 2020-05-05 MED ORDER — MENTHOL 3 MG MT LOZG
1.0000 | LOZENGE | OROMUCOSAL | Status: DC | PRN
  Administered 2020-05-06: 3 mg via ORAL
  Filled 2020-05-05: qty 9

## 2020-05-05 MED ORDER — COCONUT OIL OIL: 1.0000 "application " | TOPICAL_OIL | Status: DC | PRN

## 2020-05-05 MED ORDER — HYDROMORPHONE HCL 1 MG/ML IJ SOLN
0.2500 mg | INTRAMUSCULAR | Status: DC | PRN
  Administered 2020-05-05 (×2): 0.5 mg via INTRAVENOUS

## 2020-05-05 MED ORDER — SUCCINYLCHOLINE CHLORIDE 200 MG/10ML IV SOSY
PREFILLED_SYRINGE | INTRAVENOUS | Status: DC | PRN
  Administered 2020-05-05: 100 mg via INTRAVENOUS

## 2020-05-05 SURGICAL SUPPLY — 36 items
BENZOIN TINCTURE PRP APPL 2/3 (GAUZE/BANDAGES/DRESSINGS) ×3 IMPLANT
CHLORAPREP W/TINT 26ML (MISCELLANEOUS) ×3 IMPLANT
CLAMP CORD UMBIL (MISCELLANEOUS) IMPLANT
CLOSURE STERI STRIP 1/2 X4 (GAUZE/BANDAGES/DRESSINGS) ×3 IMPLANT
CLOTH BEACON ORANGE TIMEOUT ST (SAFETY) ×3 IMPLANT
DRSG OPSITE POSTOP 4X10 (GAUZE/BANDAGES/DRESSINGS) ×3 IMPLANT
ELECT REM PT RETURN 9FT ADLT (ELECTROSURGICAL) ×3
ELECTRODE REM PT RTRN 9FT ADLT (ELECTROSURGICAL) ×1 IMPLANT
EXTRACTOR VACUUM M CUP 4 TUBE (SUCTIONS) IMPLANT
EXTRACTOR VACUUM M CUP 4' TUBE (SUCTIONS)
GLOVE BIO SURGEON STRL SZ7.5 (GLOVE) ×3 IMPLANT
GLOVE BIOGEL PI IND STRL 7.0 (GLOVE) ×1 IMPLANT
GLOVE BIOGEL PI INDICATOR 7.0 (GLOVE) ×2
GOWN STRL REUS W/TWL LRG LVL3 (GOWN DISPOSABLE) ×6 IMPLANT
KIT ABG SYR 3ML LUER SLIP (SYRINGE) IMPLANT
NEEDLE HYPO 22GX1.5 SAFETY (NEEDLE) ×3 IMPLANT
NEEDLE HYPO 25X5/8 SAFETYGLIDE (NEEDLE) IMPLANT
NEEDLE SPNL 20GX3.5 QUINCKE YW (NEEDLE) IMPLANT
NS IRRIG 1000ML POUR BTL (IV SOLUTION) ×3 IMPLANT
PACK C SECTION WH (CUSTOM PROCEDURE TRAY) ×3 IMPLANT
PENCIL SMOKE EVAC W/HOLSTER (ELECTROSURGICAL) ×3 IMPLANT
SUT MNCRL 0 VIOLET CTX 36 (SUTURE) ×2 IMPLANT
SUT MNCRL AB 3-0 PS2 27 (SUTURE) IMPLANT
SUT MON AB 2-0 CT1 27 (SUTURE) ×3 IMPLANT
SUT MON AB-0 CT1 36 (SUTURE) ×6 IMPLANT
SUT MONOCRYL 0 CTX 36 (SUTURE) ×4
SUT PLAIN 0 NONE (SUTURE) IMPLANT
SUT PLAIN 2 0 (SUTURE) ×2
SUT PLAIN 2 0 XLH (SUTURE) IMPLANT
SUT PLAIN ABS 2-0 CT1 27XMFL (SUTURE) ×1 IMPLANT
SUT VIC AB 4-0 KS 27 (SUTURE) ×3 IMPLANT
SYR 20CC LL (SYRINGE) IMPLANT
SYR CONTROL 10ML LL (SYRINGE) ×3 IMPLANT
TOWEL OR 17X24 6PK STRL BLUE (TOWEL DISPOSABLE) ×3 IMPLANT
TRAY FOLEY W/BAG SLVR 14FR LF (SET/KITS/TRAYS/PACK) ×3 IMPLANT
WATER STERILE IRR 1000ML POUR (IV SOLUTION) ×3 IMPLANT

## 2020-05-05 NOTE — Telephone Encounter (Signed)
Preadmission screen  

## 2020-05-05 NOTE — MAU Note (Signed)
2035 Dr Billy Coast arrived to bedside and consent was signed. 2038 He checked cervix and stated "there are feet in the vagina, we have to go."   2039 Code cesarean was called.  Pt transported to OB OR with MAU provider in elevator and handed off to [facility] staff.

## 2020-05-05 NOTE — Telephone Encounter (Signed)
Preadmission screen Pt instructed to arrive at 2 PM on 10/13 for her CS.  NPO after midnight (food) and clear liquids from midnight until 0800.  Pt verbalized instructions and understanding of them.

## 2020-05-05 NOTE — Anesthesia Preprocedure Evaluation (Addendum)
Anesthesia Evaluation  Patient identified by MRN, date of birth, ID band Patient awake    Reviewed: Allergy & Precautions, Unable to perform ROS - Chart review onlyPreop documentation limited or incomplete due to emergent nature of procedure.  History of Anesthesia Complications (+) MALIGNANT HYPERTHERMIA  Airway Mallampati: II  TM Distance: >3 FB Neck ROM: Full    Dental no notable dental hx. (+) Teeth Intact   Pulmonary           Cardiovascular      Neuro/Psych negative neurological ROS  negative psych ROS   GI/Hepatic   Endo/Other    Renal/GU      Musculoskeletal   Abdominal   Peds  Hematology   Anesthesia Other Findings   Reproductive/Obstetrics (+) Pregnancy                           Anesthesia Physical Anesthesia Plan  ASA: II and emergent  Anesthesia Plan: General   Post-op Pain Management:    Induction:   PONV Risk Score and Plan: Treatment may vary due to age or medical condition  Airway Management Planned: Oral ETT  Additional Equipment: None  Intra-op Plan:   Post-operative Plan:   Informed Consent:     Dental advisory given  Plan Discussed with: CRNA  Anesthesia Plan Comments: (39 wk G2P1 w oligo and transverse lie for primary code cesarean)      Anesthesia Quick Evaluation

## 2020-05-05 NOTE — Anesthesia Procedure Notes (Signed)
Procedure Name: Intubation Date/Time: 05/05/2020 8:50 PM Performed by: Renford Dills, CRNA Pre-anesthesia Checklist: Patient identified, Patient being monitored, Timeout performed, Emergency Drugs available and Suction available Patient Re-evaluated:Patient Re-evaluated prior to induction Oxygen Delivery Method: Circle System Utilized Preoxygenation: Pre-oxygenation with 100% oxygen Induction Type: IV induction, Rapid sequence and Cricoid Pressure applied Laryngoscope Size: 3 and Glidescope Grade View: Grade II Tube type: Oral Tube size: 7.0 mm Number of attempts: 2 Airway Equipment and Method: stylet and Video-laryngoscopy Placement Confirmation: ETT inserted through vocal cords under direct vision,  positive ETCO2 and breath sounds checked- equal and bilateral Secured at: 21 cm Tube secured with: Tape Dental Injury: Teeth and Oropharynx as per pre-operative assessment

## 2020-05-05 NOTE — H&P (Signed)
Pamela Stephens is a 34 y.o. female presenting for labor with known transverse presentation and oligo for csection.. OB History    Gravida  2   Para  1   Term  1   Preterm      AB      Living  1     SAB      TAB      Ectopic      Multiple  0   Live Births  1          Past Medical History:  Diagnosis Date  . Medical history non-contributory   . Pregnancy, high-risk 09/04/2017  . Thrombocytopenia affecting pregnancy, antepartum (HCC) 09/04/2017   Platelet count 15,000 07/31/2017 2nd trimester 28 weeks 1st pregnancy   Past Surgical History:  Procedure Laterality Date  . NO PAST SURGERIES     Family History: family history includes Cancer in her maternal grandmother; Diabetes in her maternal grandmother and mother. Social History:  reports that she has never smoked. She has never used smokeless tobacco. She reports that she does not drink alcohol and does not use drugs.     Maternal Diabetes: No Genetic Screening: Normal Maternal Ultrasounds/Referrals: Normal Fetal Ultrasounds or other Referrals:  None Maternal Substance Abuse:  No Significant Maternal Medications:  None Significant Maternal Lab Results:  Group B Strep negative Other Comments:  None  Review of Systems  Constitutional: Negative.   All other systems reviewed and are negative.  Maternal Medical History:  Reason for admission: Rupture of membranes and contractions.   Contractions: Onset was less than 1 hour ago.   Frequency: regular.   Perceived severity is moderate.    Fetal activity: Perceived fetal activity is decreased.   Last perceived fetal movement was within the past hour.    Prenatal complications: Thrombocytopenia.   Prenatal Complications - Diabetes: none.      Pulse 100, temperature 98.3 F (36.8 C), temperature source Oral, resp. rate 18, height 5\' 5"  (1.651 m), weight 67.3 kg, SpO2 100 %, unknown if currently breastfeeding. Maternal Exam:  Uterine Assessment:  Contraction strength is moderate.  Contraction frequency is regular.   Abdomen: Patient reports no abdominal tenderness. Fetal presentation: breech  Introitus: Normal vulva. Normal vagina.  Ferning test: not done.  Nitrazine test: not done. Amniotic fluid character: not assessed.  Pelvis: questionable for delivery.   Cervix: Cervix evaluated by digital exam.     Physical Exam Vitals and nursing note reviewed.  Constitutional:      Appearance: Normal appearance.  HENT:     Head: Normocephalic and atraumatic.  Cardiovascular:     Rate and Rhythm: Normal rate and regular rhythm.     Pulses: Normal pulses.     Heart sounds: Normal heart sounds.  Pulmonary:     Effort: Pulmonary effort is normal.     Breath sounds: Normal breath sounds.  Abdominal:     Palpations: Abdomen is soft.  Genitourinary:    General: Normal vulva.  Musculoskeletal:        General: Normal range of motion.     Cervical back: Normal range of motion and neck supple.  Skin:    General: Skin is warm and dry.  Neurological:     General: No focal deficit present.     Mental Status: She is alert and oriented to person, place, and time.  Psychiatric:        Mood and Affect: Mood normal.        Behavior: Behavior normal.  Prenatal labs: ABO, Rh: B/Positive/-- (03/18 0000) Antibody: Negative (03/18 0000) Rubella: Nonimmune (03/18 0000) RPR: Nonreactive (03/18 0000)  HBsAg: Negative (03/18 0000)  HIV: Non-reactive (03/18 0000)  GBS:   neg  Assessment/Plan: 39 week iup Labor Transverse lie, oliog Proceed with csection. Consent done.    Samantha Ragen J 05/05/2020, 8:14 PM

## 2020-05-05 NOTE — Transfer of Care (Signed)
Immediate Anesthesia Transfer of Care Note  Patient: Pamela Stephens  Procedure(s) Performed: Primary CESAREAN SECTION (N/A )  Patient Location: PACU  Anesthesia Type:General  Level of Consciousness: sedated and drowsy  Airway & Oxygen Therapy: Patient Spontanous Breathing and Patient connected to nasal cannula oxygen  Post-op Assessment: Report given to RN and Post -op Vital signs reviewed and stable  Post vital signs: Reviewed and stable  Last Vitals:  Vitals Value Taken Time  BP    Temp    Pulse 87 05/05/20 2147  Resp 19 05/05/20 2147  SpO2 100 % 05/05/20 2147  Vitals shown include unvalidated device data.  Last Pain:  Vitals:   05/05/20 2140  TempSrc:   PainSc: (P) 0-No pain         Complications: No complications documented.

## 2020-05-05 NOTE — Op Note (Signed)
Cesarean Section Procedure Note  Indications: malpresentation: footling breecch  Pre-operative Diagnosis: 39 week 2 day pregnancy.  Post-operative Diagnosis: same  Surgeon: Lenoard Aden   Assistants: Renae Fickle, CNM  Anesthesia: General LMA anesthesia and Local anesthesia 0.25.% bupivacaine  ASA Class: 2  Procedure Details  The patient was seen in the Holding Room. The risks, benefits, complications, treatment options, and expected outcomes were discussed with the patient.  The patient concurred with the proposed plan, giving informed consent. The risks of anesthesia, infection, bleeding and possible injury to other organs discussed. Injury to bowel, bladder, or ureter with possible need for repair discussed. Possible need for transfusion with secondary risks of hepatitis or HIV acquisition discussed. Post operative complications to include but not limited to DVT, PE and Pneumonia noted. The site of surgery properly noted/marked. The patient was taken to Operating Room # A, identified as Pamela Stephens and the procedure verified as C-Section Delivery. A Time Out was held and the above information confirmed.  After induction of anesthesia, the patient was draped and prepped in the usual sterile manner. A Pfannenstiel incision was made and carried down through the subcutaneous tissue to the fascia. Fascial incision was made and extended transversely using Mayo scissors. The fascia was separated from the underlying rectus tissue superiorly and inferiorly. The peritoneum was identified and entered. Peritoneal incision was extended longitudinally. The utero-vesical peritoneal reflection was incised transversely and the bladder flap was bluntly freed from the lower uterine segment. A low transverse uterine incision(Kerr hysterotomy) was made. Delivered from footling breech presentation was a  female with Apgar scores of 8 at one minute and 9 at five minutes. Bulb suctioning gently performed. Neonatal  team in attendance.After the umbilical cord was clamped and cut cord blood was obtained for evaluation. The placenta was removed intact and appeared normal. The uterus was curetted with a dry lap pack. Good hemostasis was noted.The uterine outline, tubes and ovaries appeared normal. The uterine incision was closed with running locked sutures of 0 Monocryl x 2 layers. Hemostasis was observed. Lavage was carried out until clear.The parietal peritoneum was closed with a running 2-0 Monocryl suture. The fascia was then reapproximated with running sutures of 0 Monocryl. The skin was reapproximated with 4--0 vicryl after [place] closure wit 2-0 plaion..  Instrument, sponge, and needle counts were correct prior the abdominal closure and at the conclusion of the case.   Findings: Feet at introitus, Footling breech , post placenta. Nl uterus , nl adnexa  Estimated Blood Loss:  450         Drains: foley                 Specimens: placenta                 Complications:  None; patient tolerated the procedure well.         Disposition: PACU - hemodynamically stable.         Condition: stable  Attending Attestation: I performed the procedure.

## 2020-05-06 ENCOUNTER — Encounter (HOSPITAL_COMMUNITY): Payer: Self-pay | Admitting: Obstetrics and Gynecology

## 2020-05-06 ENCOUNTER — Inpatient Hospital Stay (HOSPITAL_COMMUNITY)
Admission: AD | Admit: 2020-05-06 | Payer: Managed Care, Other (non HMO) | Source: Home / Self Care | Admitting: Obstetrics and Gynecology

## 2020-05-06 ENCOUNTER — Other Ambulatory Visit (HOSPITAL_COMMUNITY): Payer: Managed Care, Other (non HMO)

## 2020-05-06 LAB — CBC
HCT: 35.2 % — ABNORMAL LOW (ref 36.0–46.0)
Hemoglobin: 11.6 g/dL — ABNORMAL LOW (ref 12.0–15.0)
MCH: 30.4 pg (ref 26.0–34.0)
MCHC: 33 g/dL (ref 30.0–36.0)
MCV: 92.1 fL (ref 80.0–100.0)
Platelets: 117 10*3/uL — ABNORMAL LOW (ref 150–400)
RBC: 3.82 MIL/uL — ABNORMAL LOW (ref 3.87–5.11)
RDW: 13.8 % (ref 11.5–15.5)
WBC: 17.9 10*3/uL — ABNORMAL HIGH (ref 4.0–10.5)
nRBC: 0 % (ref 0.0–0.2)

## 2020-05-06 LAB — RPR: RPR Ser Ql: NONREACTIVE

## 2020-05-06 SURGERY — Surgical Case
Anesthesia: Regional

## 2020-05-06 MED ORDER — INFLUENZA VAC SPLIT QUAD 0.5 ML IM SUSY
0.5000 mL | PREFILLED_SYRINGE | INTRAMUSCULAR | Status: AC
  Administered 2020-05-08: 0.5 mL via INTRAMUSCULAR
  Filled 2020-05-06 (×2): qty 0.5

## 2020-05-06 NOTE — Anesthesia Postprocedure Evaluation (Signed)
Anesthesia Post Note  Patient: Pamela Stephens  Procedure(s) Performed: Primary CESAREAN SECTION (N/A )     Patient location during evaluation: Mother Baby Anesthesia Type: General Level of consciousness: awake and alert Pain management: pain level controlled Vital Signs Assessment: post-procedure vital signs reviewed and stable Respiratory status: spontaneous breathing, nonlabored ventilation and respiratory function stable Cardiovascular status: blood pressure returned to baseline and stable Postop Assessment: no apparent nausea or vomiting Anesthetic complications: no   No complications documented.  Last Vitals:  Vitals:   05/06/20 0109 05/06/20 0202  BP: 115/68   Pulse: 74   Resp: 18 18  Temp: 36.8 C 36.9 C  SpO2: 99% 98%    Last Pain:  Vitals:   05/06/20 0202  TempSrc: Oral  PainSc:    Pain Goal:                   Trevor Iha

## 2020-05-06 NOTE — Lactation Note (Signed)
This note was copied from a baby's chart. Lactation Consultation Note  Patient Name: Girl Kadynce Bonds [id number] Date: 05/06/2020 Reason for consult: Initial assessment;Term  Initial assessment to 74 hours old infant with 1.42% weight loss of a P2 mother. Mother reports hx of challenges with breastfeeding due to difficult latch, low milk supply and exclusive pumping and bottle-feeding. Mother's feeding goal for this infant: breast and formula feeding.  Mother has a breast pump at home. Offered DEBP for proper stimulation and to establish good milk supply, mother agreed and DEBP was set up. Provided education regarding frequency, cleaning and milk storage.   Parents are formula feeding infant following guidelines (last feeding ~4mL). Mother plans to breastfeeding as soon as she feels better.   Plan: 1-Pumping every time formula is used and feed infant collected EBM before formula.  2-Feeding infant following hunger cues or 8-12 times in 24 hours period.  3-Contacting LC when ready to latch infant or as needed for support/concerns/questions 4-Practicing self-care with good nutrition, hydration and rest.   All questions answered at this time.    Maternal Data Formula Feeding for Exclusion: No Has patient been taught Hand Expression?: No Does the patient have breastfeeding experience prior to this delivery?: Yes  Feeding Feeding Type: Bottle Fed - Formula  Interventions Interventions: Breast feeding basics reviewed;DEBP  Lactation Tools Discussed/Used WIC Program: No Pump Review: Setup, frequency, and cleaning;Milk Storage Initiated by:: Llewelyn Sheaffer IBCLC Date initiated:: 05/06/20   Consult Status Consult Status: Follow-up Date: 05/07/20 Follow-up type: In-patient    Eliza Green A Higuera Ancidey 05/06/2020, 4:06 PM

## 2020-05-06 NOTE — Progress Notes (Signed)
POSTOPERATIVE DAY # 1 S/P Primary LTCS for breech presentation in active labor, baby girl    S:         Reports feeling very sore; also states throat is sore, but using Cepacol lozenges             Tolerating po intake / no nausea / no vomiting / no flatus / no BM  Denies dizziness, SOB, or CP             Bleeding is light             Pain controlled withToradol             Up ad lib / ambulatory/ voiding QS  Newborn breast feeding with formula supplementation; states her abdomen hurts with breastfeeding, so desires to pump   O:  VS: BP 108/65 (BP Location: Right Arm)   Pulse 72   Temp 98.2 F (36.8 C) (Oral)   Resp 18   Ht 5' 5"  (1.651 m)   Wt 67.3 kg   SpO2 99%   Breastfeeding Unknown   BMI 24.68 kg/m    LABS:              Recent Labs    05/05/20 2012 05/06/20 0529  WBC 13.5* 17.9*  HGB 14.3 11.6*  PLT 125* 117*               Bloodtype: --/--/B POS (10/12 2012)  Rubella: Nonimmune (03/18 0000)                                             I&O: Intake/Output      10/12 0701 - 10/13 0700 10/13 0701 - 10/14 0700   I.V. (mL/kg) 2500 (37.1)    Total Intake(mL/kg) 2500 (37.1)    Urine (mL/kg/hr) 1450    Blood 750    Total Output 2200    Net +300                      Physical Exam:             Alert and Oriented X3  Lungs: Clear and unlabored  Heart: regular rate and rhythm / no murmurs  Abdomen: soft, non-tender, non-distended, active bowel sounds in all quadrants              Fundus: firm, non-tender, U-2             Dressing: honeycomb dressing with steri-strips; old drainage marked, otherwise intact               Incision:  approximated with sutures  / no erythema / no ecchymosis / no drainage  Perineum: intact  GU: foley catheter in pace   Lochia: scant rubra  Extremities: no edema, no calf pain or tenderness, SCDs on  A:        POD # 1 S/P Primary LTCS            Gestational thrombocytopenia   - plts stable  Rubella Non-immune   - offer MMR vax prior to  d/c  Routine postoperative care              Ambulation and warm liquids encouraged to promote bowel motility   Lactation support PRN  D/c foley catheter   Continue current care   Lars Pinks, MSN, CNM [facility]

## 2020-05-07 NOTE — Progress Notes (Signed)
No c/o; pain better controlled today; voids w/o difficulty; +flatus, ambulating tol po; breastfeeding; normal lochia  Patient Vitals for the past 24 hrs:  BP Temp Temp src Pulse Resp SpO2  05/07/20 0540 (!) 97/50 98.3 F (36.8 C) Oral 85 18 100 %  05/06/20 1958 102/70 98.6 F (37 C) Oral 90 18 98 %  05/06/20 1616 109/64 98.2 F (36.8 C) Oral 92 18 --  05/06/20 1322 116/73 98.1 F (36.7 C) Oral 90 18 --   A&ox3 rrr ctab Abd: +bd; soft, nt nd; fundus firm and below umb; dressing: c/d/i LE: no edema, nt bilat  CBC Latest Ref Rng & Units 05/06/2020 05/05/2020 12/05/2017  WBC 4.0 - 10.5 K/uL 17.9(H) 13.5(H) 8.6  Hemoglobin 12.0 - 15.0 g/dL 11.6(L) 14.3 14.1  Hematocrit 36 - 46 % 35.2(L) 44.6 43.9  Platelets 150 - 400 K/uL 117(L) 125(L) 370    A/P: pod 2 s/p 1ltcs (GETA), footling breech 1. Doing well, contin care and plan d/c home tomorrow 2. RH pos 3. R NI- plan mmr before d/c home 4. Gest. Thrombocytopenia - plan f/u at pp visit

## 2020-05-07 NOTE — Lactation Note (Signed)
This note was copied from a baby's chart. Lactation Consultation Note Baby 28 hrs old. Mom called out for latch assistance. Mom hasn't put baby to the breast d/t unable to sit up and tolerate it. Mom has mostly been formula feeding.  Experienced BF mom. Having problems w/positionoing. Hand expressed colostrum to tip of nipple. Then in football position assisted mom in latching baby. Mom stated it wasn't bad. Baby has thick labial frenulum, probable lingual frenulum. W/gloved finger baby feels like chomping. Baby had a deep latch. Encouraged upper lip flange and chin tug if needed.  Information on tongue-tie contacts given.  Patient Name: Pamela Stephens [id number] Date: 05/07/2020 Reason for consult: Mother's request;Term   Maternal Data    Feeding Feeding Type: Formula Nipple Type: Extra Slow Flow  LATCH Score Latch: Grasps breast easily, tongue down, lips flanged, rhythmical sucking.  Audible Swallowing: A few with stimulation  Type of Nipple: Everted at rest and after stimulation  Comfort (Breast/Nipple): Soft / non-tender  Hold (Positioning): Assistance needed to correctly position infant at breast and maintain latch.  LATCH Score: 8  Interventions Interventions: Breast feeding basics reviewed;Assisted with latch;Breast compression;Skin to skin;Adjust position;Breast massage;Support pillows;Hand express;Position options  Lactation Tools Discussed/Used Tools: Pump   Consult Status Consult Status: Follow-up Date: 05/07/20 (in pm) Follow-up type: In-patient    Pamela Stephens, Pamela Stephens 05/07/2020, 1:29 AM

## 2020-05-07 NOTE — Lactation Note (Signed)
This note was copied from a baby's chart. Lactation Consultation Note  Patient Name: [name] [id number] Date: 05/07/2020 Reason for consult: Follow-up assessment  P2 mother whose infant is now 54 hours old.  This is a term baby at 39+2 weeks.  Mother's feeding preference is breast/bottle.  Mother requested latch assistance.  Baby was swaddled and asleep when I arrived.  Mother had fed 25 mls of formula at 0700.  Parents desired latch assistance even though baby was not showing feeding cues.    Helped mother position appropriately in the bed; encouraged good pillow support.  Mother demonstrated hand expression, however, no colostrum drops noted at this time.  Assessed baby's suck on my gloved finger and she needed assistance to begin sucking rhythmically.  Allowed her time to obtain a good sucking pattern and then assisted to latch in the football hold to the right breast.  Baby latched easily and began sucking.  Discussed basic breast feeding concepts while observing baby feed for 6 minutes before she self released. Demonstrated breast compressions and gentle stimulation.  Mother felt a tug but no pain during feeding.    Mother desires to pump with the DEBP, however, she states it hurts too much in her stomach.  Discussed decreasing the suction pressure but mother desires to use the manual pump instead.  Suggested she obtain pain medicines as ordered and wait for an hour to attempt pumping again.  Demonstrated proper use of the manual pump for mother and encouraged hand expression before/after feedings to help with milk supply.  Mother has a DEBP for home use.  Father present.  RN Updated.   Maternal Data    Feeding Feeding Type: Breast Fed  LATCH Score Latch: Repeated attempts needed to sustain latch, nipple held in mouth throughout feeding, stimulation needed to elicit sucking reflex.  Audible Swallowing: A few with stimulation  Type of Nipple: Everted at rest and after  stimulation  Comfort (Breast/Nipple): Soft / non-tender  Hold (Positioning): Assistance needed to correctly position infant at breast and maintain latch.  LATCH Score: 7  Interventions Interventions: Breast feeding basics reviewed;Assisted with latch;Skin to skin;Breast massage;Hand express;Breast compression;Adjust position;DEBP;Hand pump;Position options;Support pillows  Lactation Tools Discussed/Used     Consult Status Consult Status: Follow-up Date: 05/08/20 Follow-up type: In-patient    Asim Gersten R Jaevon Paras 05/07/2020, 11:12 AM

## 2020-05-08 ENCOUNTER — Inpatient Hospital Stay (HOSPITAL_COMMUNITY): Payer: Managed Care, Other (non HMO)

## 2020-05-08 MED ORDER — COCONUT OIL OIL: 1.0000 "application " | TOPICAL_OIL | 0 refills | Status: AC | PRN

## 2020-05-08 MED ORDER — MEASLES, MUMPS & RUBELLA VAC IJ SOLR: 0.5000 mL | Freq: Once | INTRAMUSCULAR | Status: DC

## 2020-05-08 MED ORDER — OXYCODONE-ACETAMINOPHEN 5-325 MG PO TABS: 1.0000 | ORAL_TABLET | ORAL | 0 refills | Status: AC | PRN

## 2020-05-08 MED ORDER — IBUPROFEN 800 MG PO TABS: 800.0000 mg | ORAL_TABLET | Freq: Four times a day (QID) | ORAL | 0 refills | Status: AC

## 2020-05-08 MED ORDER — SENNOSIDES-DOCUSATE SODIUM 8.6-50 MG PO TABS: 2.0000 | ORAL_TABLET | Freq: Every evening | ORAL | 0 refills | Status: AC | PRN

## 2020-05-08 MED ORDER — SIMETHICONE 80 MG PO CHEW: 80.0000 mg | CHEWABLE_TABLET | Freq: Three times a day (TID) | ORAL | 0 refills | Status: AC

## 2020-05-08 NOTE — Lactation Note (Signed)
This note was copied from a baby's chart. Lactation Consultation Note  Patient Name: Girl Joory Gough [id number] Date: 05/08/2020 Reason for consult: Follow-up assessment  P2 mother whose infant is now 38 hours old.  This is a term baby at 39+2 weeks.  Mother's feeding preference is breast/bottle.    Baby was asleep in mother's lap when I arrived.  Mother is primarily bottle feeding but is putting baby to the breast also.  She has been following the pumping routine set up yesterday and was able to obtain 12 mls with her last pumping session.  Mother knows to feed back any EBM she obtains to baby prior to giving any formula supplementation.  Engorgement prevention/treatment reviewed.  Mother has a manual pump and a DEBP for home use.  She has our OP phone number for any questions after discharge.  Mother has a return visit to the pediatrician on Monday and also an OP LC in the same office.  Encouraged to continue feeding as often as baby desires over the weekend.  Mother verbalized understanding.  Father present.   Maternal Data    Feeding Feeding Type: Bottle Fed - Breast Milk Nipple Type: Slow - flow  LATCH Score                   Interventions    Lactation Tools Discussed/Used     Consult Status Consult Status: Complete Date: 05/08/20 Follow-up type: Call as needed    Cathi Hazan R Deniss Wormley 05/08/2020, 9:16 AM

## 2020-05-08 NOTE — Discharge Summary (Signed)
Postpartum Discharge Summary  Date of Service updated 05/08/2020     Patient Name: Pamela Stephens DOB: June 11, 1986 MRN: [id number]  Date of admission: 05/05/2020 Delivery date:05/05/2020  Delivering provider: Brien Few  Date of discharge: 05/08/2020  Admitting diagnosis: Breech birth [O32.1XX0] Intrauterine pregnancy: [redacted]w[redacted]d    Secondary diagnosis:  Principal Problem:   Postpartum care following cesarean delivery 10/12 Active Problems:   Thrombocytopenia affecting pregnancy, antepartum ([facility]   Cesarean delivery - STAT breech in labor   Breech birth  Additional problems: Rubella non-immune    Discharge diagnosis: Term Pregnancy Delivered                                              Post partum procedures:n/a Augmentation: N/A Complications: None  Hospital course: Onset of Labor With Unplanned C/S   34y.o. yo G2P2002 at 333w2das admitted in [place] 05/05/2020. Patient had a labor course significant for active labor at term and baby was footling breech. The patient went for cesarean section under general anesthesia [place]: Membrane Rupture Time/Date: 8:52 PM ,05/05/2020   Delivery Method:C-Section, Low Transverse  Details of operation can be found in separate operative note. Patient had an uncomplicated postpartum course.  She is ambulating,tolerating a regular diet, passing flatus, and urinating well.  She has not had a bowel movement yet. Patient is discharged home in stable condition 05/08/20.  Newborn Data: Birth date:05/05/2020  Birth time:8:52 PM  Gender:Female  Living status:Living  Apgars:7 ,8  Weight:2810 g   Magnesium Sulfate received: No BMZ received: No Rhophylac:N/A MMR:Yes given PP 05/08/20 T-DaP:Given prenatally Flu: Yes given PP 05/08/20 Transfusion:No  COVID vaccine: planning 2 weeks PP  Physical exam  Vitals:   05/07/20 0540 05/07/20 1454 05/07/20 2028 05/08/20 0553  BP: (!) 97/50  107/61 113/60 121/70  Pulse: 85 93 90 74  Resp: 18 16 15 16   Temp: 98.3 F (36.8 C) 98.5 F (36.9 C) 99 F (37.2 C)   TempSrc: Oral Oral Oral Oral  SpO2: 100%  99% 100%  Weight:      Height:       General: alert, cooperative and no distress  Heart: RRR Lungs: Clear, bilaterally  Lochia: appropriate Abdomen: mild gaseous distention, active bowel sounds Uterine Fundus: firm, below umbilicus Incision: Healing well with no significant drainage, No significant erythema, old marked drainage noted; otherwise intact DVT Evaluation: No evidence of DVT seen on physical exam. No significant calf/ankle edema. Labs: Lab Results  Component Value Date   WBC 17.9 (H) 05/06/2020   HGB 11.6 (L) 05/06/2020   HCT 35.2 (L) 05/06/2020   MCV 92.1 05/06/2020   PLT 117 (L) 05/06/2020   CMP Latest Ref Rng & Units 09/04/2017  Glucose 65 - 99 mg/dL 118(H)  BUN 6 - 20 mg/dL <5(L)  Creatinine 0.44 - 1.00 mg/dL 0.57  Sodium 135 - 145 mmol/L 138  Potassium 3.5 - 5.1 mmol/L 3.6  Chloride 101 - 111 mmol/L 102  CO2 22 - 32 mmol/L 24  Calcium 8.9 - 10.3 mg/dL 9.2  Total Protein 6.5 - 8.1 g/dL 6.9  Total Bilirubin 0.3 - 1.2 mg/dL 0.3  Alkaline Phos 38 - 126 U/L 115  AST 15 - 41 U/L 35  ALT 14 - 54 U/L 25   Edinburgh Score: Edinburgh Postnatal Depression Scale Screening Tool 05/05/2020  I have been able to laugh and see the funny side of things. (No Data)  I have looked forward with enjoyment to things. -  I have blamed myself unnecessarily when things went wrong. -  I have been anxious or worried for no good reason. -  I have felt scared or panicky for no good reason. -  Things have been getting on top of me. -  I have been so unhappy that I have had difficulty sleeping. -  I have felt sad or miserable. -  I have been so unhappy that I have been crying. -  The thought of harming myself has occurred to me. Flavia Shipper Postnatal Depression Scale Total -      After visit meds:  Allergies as of  05/08/2020   No Known Allergies     Medication List    TAKE these medications   coconut oil Oil Apply 1 application topically as needed.   ibuprofen 800 MG tablet Commonly known as: ADVIL Take 1 tablet (800 mg total) by mouth every 6 (six) hours.   oxyCODONE-acetaminophen 5-325 MG tablet Commonly known as: PERCOCET/ROXICET Take 1-2 tablets by mouth every 4 (four) hours as needed for moderate pain.   prenatal multivitamin Tabs tablet Take 1 tablet by mouth daily at 12 noon.   senna-docusate 8.6-50 MG tablet Commonly known as: Senokot-S Take 2 tablets by mouth at bedtime as needed for mild constipation.   simethicone 80 MG chewable tablet Commonly known as: MYLICON Chew 1 tablet (80 mg total) by mouth 3 (three) times daily after meals.        Discharge home in stable condition Infant Feeding: Bottle and Breast Infant Disposition:home with mother Discharge instruction: per After Visit Summary and Postpartum booklet. Activity: Advance as tolerated. Pelvic rest for 6 weeks.  Diet: low salt diet Anticipated Birth Control: Unsure Postpartum Appointment:6 weeks Additional Postpartum F/U: Postpartum Depression checkup Future Appointments:No future appointments. Follow up Visit:  Follow-up Information    Brien Few, MD. Schedule an appointment as soon as possible for a visit in 6 week(s).   Specialty: Obstetrics and Gynecology Why: Postpartum visit Contact information: [place] [address] [phone]                   05/08/2020 Darliss Cheney, CNM

## 2020-05-12 ENCOUNTER — Inpatient Hospital Stay (HOSPITAL_COMMUNITY)
Admission: AD | Admit: 2020-05-12 | Discharge: 2020-05-13 | Disposition: A | Discharge status: Home / Self Care | Payer: Managed Care, Other (non HMO) | Attending: Obstetrics and Gynecology | Admitting: Obstetrics and Gynecology

## 2020-05-12 ENCOUNTER — Encounter (HOSPITAL_COMMUNITY): Payer: Self-pay | Admitting: Obstetrics and Gynecology

## 2020-05-12 ENCOUNTER — Emergency Department (HOSPITAL_BASED_OUTPATIENT_CLINIC_OR_DEPARTMENT_OTHER)
Admission: EM | Admit: 2020-05-12 | Discharge: 2020-05-12 | Disposition: A | Discharge status: Home / Self Care | Payer: Managed Care, Other (non HMO)

## 2020-05-12 ENCOUNTER — Other Ambulatory Visit: Payer: Self-pay

## 2020-05-12 DIAGNOSIS — N939 Abnormal uterine and vaginal bleeding, unspecified: Secondary | ICD-10-CM | POA: Diagnosis not present

## 2020-05-12 LAB — CBC
HCT: 37 % (ref 36.0–46.0)
Hemoglobin: 12 g/dL (ref 12.0–15.0)
MCH: 29.9 pg (ref 26.0–34.0)
MCHC: 32.4 g/dL (ref 30.0–36.0)
MCV: 92.3 fL (ref 80.0–100.0)
Platelets: 294 10*3/uL (ref 150–400)
RBC: 4.01 MIL/uL (ref 3.87–5.11)
RDW: 13.5 % (ref 11.5–15.5)
WBC: 9 10*3/uL (ref 4.0–10.5)
nRBC: 0 % (ref 0.0–0.2)

## 2020-05-12 NOTE — MAU Note (Signed)
Pt reports c/s on 10/12, this pm she had sudden onset of very heavy bleeding . Denies dizziness. Denies pain at this time.

## 2020-05-12 NOTE — Discharge Instructions (Signed)

## 2020-05-12 NOTE — MAU Provider Note (Signed)
History     [id number]  Arrival date and time: 05/12/20 2049    Chief Complaint  Patient presents with  . Vaginal Bleeding     HPI Pamela Stephens is a 34 y.o. POD#7 stat pLTCS for frank breech presentation in labor with PMHx notable for thrombocytopenia, who presents for vaginal bleeding. Patient's bleeding had previously been decreasing since discharge and today she had a gush of dark blood soaking a pad as well as 3 quarter size clots. She denies any associated abdominal pain or vaginal pain. No fever/chills. No lightheadedness or dizziness. No sob or palpitations. No urinary symptoms.   Review of discharge summary from last admission on 10/15 and op note from 10/12 revealing an uncomplicated cesarean section without complications. Discharge hgb  11.6 and platelets 117.  --/--/B POS (10/12 2012)  Past Medical History:  Diagnosis Date  . Medical history non-contributory   . Pregnancy, high-risk 09/04/2017  . Thrombocytopenia affecting pregnancy, antepartum (HCC) 09/04/2017   Platelet count 15,000 07/31/2017 2nd trimester 28 weeks 1st pregnancy    Past Surgical History:  Procedure Laterality Date  . CESAREAN SECTION N/A 05/05/2020   Procedure: Primary CESAREAN SECTION;  Surgeon: Olivia Mackie, MD;  Location: [facility];  Service: Obstetrics;  Laterality: N/A;  EDD: 05/10/20    Family History  Problem Relation Age of Onset  . Diabetes Mother   . Cancer Maternal Grandmother   . Diabetes Maternal Grandmother     Social History   Socioeconomic History  . Marital status: Married    Spouse name: Not on file  . Number of children: Not on file  . Years of education: Not on file  . Highest education level: Not on file  Occupational History  . Not on file  Tobacco Use  . Smoking status: Never Smoker  . Smokeless tobacco: Never Used  Substance and Sexual Activity  . Alcohol use: No  . Drug use: No  . Sexual activity: Yes  Other Topics Concern  . Not on file  Social  History Narrative  . Not on file   Social Determinants of Health   Financial Resource Strain:   . Difficulty of Paying Living Expenses: Not on file  Food Insecurity:   . Worried About Programme researcher, broadcasting/film/video in the Last Year: Not on file  . Ran Out of Food in the Last Year: Not on file  Transportation Needs:   . Lack of Transportation (Medical): Not on file  . Lack of Transportation (Non-Medical): Not on file  Physical Activity:   . Days of Exercise per Week: Not on file  . Minutes of Exercise per Session: Not on file  Stress:   . Feeling of Stress : Not on file  Social Connections:   . Frequency of Communication with Friends and Family: Not on file  . Frequency of Social Gatherings with Friends and Family: Not on file  . Attends Religious Services: Not on file  . Active Member of Clubs or Organizations: Not on file  . Attends Banker Meetings: Not on file  . Marital Status: Not on file  Intimate Partner Violence:   . Fear of Current or Ex-Partner: Not on file  . Emotionally Abused: Not on file  . Physically Abused: Not on file  . Sexually Abused: Not on file    No Known Allergies  No current facility-administered medications on file prior to encounter.   Current Outpatient Medications on File Prior to Encounter  Medication Sig Dispense Refill  .  coconut oil OIL Apply 1 application topically as needed.  0  . ibuprofen (ADVIL) 800 MG tablet Take 1 tablet (800 mg total) by mouth every 6 (six) hours. 30 tablet 0  . oxyCODONE-acetaminophen (PERCOCET/ROXICET) 5-325 MG tablet Take 1-2 tablets by mouth every 4 (four) hours as needed for moderate pain. 30 tablet 0  . Prenatal Vit-Fe Fumarate-FA (PRENATAL MULTIVITAMIN) TABS tablet Take 1 tablet by mouth daily at 12 noon.    . senna-docusate (SENOKOT-S) 8.6-50 MG tablet Take 2 tablets by mouth at bedtime as needed for mild constipation. 30 tablet 0  . simethicone (MYLICON) 80 MG chewable tablet Chew 1 tablet (80 mg total) by  mouth 3 (three) times daily after meals. 30 tablet 0     ROS Pertinent positives and negative per HPI, all others reviewed and negative  Physical Exam   BP (!) 147/90   Pulse 68   Temp 98.4 F (36.9 C) (Oral)   Resp 17   SpO2 100%   Physical Exam Vitals and nursing note reviewed. Exam conducted with a chaperone present.  Constitutional:      General: She is not in acute distress.    Appearance: Normal appearance. She is normal weight.  HENT:     Head: Normocephalic and atraumatic.     Nose: Nose normal.     Mouth/Throat:     Mouth: Mucous membranes are moist.     Pharynx: Oropharynx is clear.  Eyes:     Extraocular Movements: Extraocular movements intact.     Conjunctiva/sclera: Conjunctivae normal.  Cardiovascular:     Rate and Rhythm: Normal rate.     Pulses: Normal pulses.  Pulmonary:     Effort: Pulmonary effort is normal.  Abdominal:     General: Abdomen is flat.     Palpations: Abdomen is soft.     Tenderness: There is no abdominal tenderness. There is no guarding or rebound.     Comments: Incision c/d/i with steri strips in place.  Genitourinary:    Comments: Dark red blood in vaginal canal and at cervical os. No active bleeding noted from cervical os.  Musculoskeletal:        General: Normal range of motion.     Cervical back: Normal range of motion and neck supple.  Skin:    General: Skin is warm and dry.  Neurological:     General: No focal deficit present.     Mental Status: She is alert and oriented to person, place, and time. Mental status is at baseline.  Psychiatric:        Mood and Affect: Mood normal.        Behavior: Behavior normal.     Labs No results found for this or any previous visit (from the past 24 hour(s)).  Imaging No results found.  MAU Course  Procedures Lab Orders  No laboratory test(s) ordered today   No orders of the defined types were placed in this encounter.  Imaging Orders  No imaging studies ordered today     MDM mild  Assessment and Plan  34yo G2P2002 POD#7 after stat pLTCS for frank breech presentation presents to the MAU for evaluation of postpartum vaginal bleeding.  #Post partum vaginal bleeding Patient presents after 1 episode of brisk bleeding a couple of hours prior to presentation. Patient had previously been having decreased bleeding since her delivery. She is asymptomatic from an anemia standpoint. Speculum exam with some dark red blood in vaginal canal but no brisk bleeding noted. Discharge  hemoglobin 11.6, repeat today 12.0. Patients platelets stable 294 (117 on discharge). Given patient is asymptomatic and hemodynamically stable, suspect most likely normal post partum bleeding. Patient given strict return precautions should she have continued bleeding, or symptoms including lightheadedness, dizziness etc. Patient instructed to follow up with her OBGYN, voiced understanding.  Alric Seton

## 2020-05-13 LAB — TYPE AND SCREEN
ABO/RH(D): B POS
Antibody Screen: NEGATIVE
